# Patient Record
Sex: Female | Born: 1985 | Race: White | Hispanic: No | Marital: Married | Smoking: Never smoker
Health system: Southern US, Community
[De-identification: ages and names within clinical notes are randomized; demographics above are authoritative.]

## PROBLEM LIST (undated history)

## (undated) DIAGNOSIS — I499 Cardiac arrhythmia, unspecified: Secondary | ICD-10-CM

## (undated) DIAGNOSIS — A4901 Methicillin susceptible Staphylococcus aureus infection, unspecified site: Secondary | ICD-10-CM

## (undated) DIAGNOSIS — J189 Pneumonia, unspecified organism: Secondary | ICD-10-CM

## (undated) DIAGNOSIS — T7840XA Allergy, unspecified, initial encounter: Secondary | ICD-10-CM

## (undated) DIAGNOSIS — R87629 Unspecified abnormal cytological findings in specimens from vagina: Secondary | ICD-10-CM

## (undated) DIAGNOSIS — G43909 Migraine, unspecified, not intractable, without status migrainosus: Secondary | ICD-10-CM

## (undated) HISTORY — DX: Unspecified abnormal cytological findings in specimens from vagina: R87.629

## (undated) HISTORY — DX: Allergy, unspecified, initial encounter: T78.40XA

## (undated) HISTORY — PX: ABSCESS DRAINAGE: SHX1119

## (undated) HISTORY — DX: Migraine, unspecified, not intractable, without status migrainosus: G43.909

---

## 2004-03-20 ENCOUNTER — Other Ambulatory Visit: Admission: RE | Admit: 2004-03-20 | Discharge: 2004-03-20 | Payer: Self-pay | Admitting: Obstetrics and Gynecology

## 2004-03-21 ENCOUNTER — Other Ambulatory Visit: Admission: RE | Admit: 2004-03-21 | Discharge: 2004-03-21 | Payer: Self-pay | Admitting: Obstetrics and Gynecology

## 2006-03-09 ENCOUNTER — Emergency Department (HOSPITAL_COMMUNITY): Admission: EM | Admit: 2006-03-09 | Discharge: 2006-03-09 | Payer: Self-pay | Admitting: Emergency Medicine

## 2011-04-25 ENCOUNTER — Ambulatory Visit: Payer: Self-pay | Admitting: Family Medicine

## 2011-04-25 VITALS — BP 106/65 | HR 65 | Temp 97.8°F | Resp 16 | Ht 66.0 in | Wt 130.8 lb

## 2011-04-25 DIAGNOSIS — M545 Low back pain: Secondary | ICD-10-CM

## 2011-04-25 MED ORDER — MELOXICAM 7.5 MG PO TABS: 7.5000 mg | ORAL_TABLET | Freq: Two times a day (BID) | ORAL | Status: AC

## 2011-04-25 MED ORDER — CYCLOBENZAPRINE HCL 10 MG PO TABS: 10.0000 mg | ORAL_TABLET | Freq: Three times a day (TID) | ORAL | Status: AC | PRN

## 2011-04-25 NOTE — Progress Notes (Signed)
  Subjective:    Patient ID: Kelly Fitzgerald, female    DOB: [birth year], 26 y.o.   MRN: [id number]  HPI Patient presents with one month history of (R) sided lower back pain. Pain is worse with hip extension and when walking Paresthesias anterior thigh.  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   Pulmonary/Chest: Effort normal and breath sounds normal.  Musculoskeletal:       Lumbar back: She exhibits normal range of motion and no bony tenderness.       Back:  Neurological: She is alert.  Reflex Scores:      Patellar reflexes are 2+ on the right side and 2+ on the left side.      Achilles reflexes are 2+ on the right side and 2+ on the left side.      Neg SLR (B)      Pes planus    Assessment & Plan:   1. LBP (low back pain)  meloxicam (MOBIC) 7.5 MG tablet   Anticipatory guidance Lower back exercises provided; encouraged yoga for back and core strenghtening Proper support for shoes RTC if symptoms persist or worsen

## 2013-02-18 ENCOUNTER — Emergency Department (HOSPITAL_COMMUNITY): Payer: Self-pay

## 2013-02-18 ENCOUNTER — Encounter (HOSPITAL_COMMUNITY): Payer: Self-pay | Admitting: Emergency Medicine

## 2013-02-18 ENCOUNTER — Emergency Department (HOSPITAL_COMMUNITY)
Admission: EM | Admit: 2013-02-18 | Discharge: 2013-02-19 | Disposition: A | Discharge status: Home / Self Care | Payer: Self-pay | Attending: Emergency Medicine | Admitting: Emergency Medicine

## 2013-02-18 DIAGNOSIS — Z880 Allergy status to penicillin: Secondary | ICD-10-CM | POA: Insufficient documentation

## 2013-02-18 DIAGNOSIS — Y9389 Activity, other specified: Secondary | ICD-10-CM | POA: Insufficient documentation

## 2013-02-18 DIAGNOSIS — Y929 Unspecified place or not applicable: Secondary | ICD-10-CM | POA: Insufficient documentation

## 2013-02-18 DIAGNOSIS — W268XXA Contact with other sharp object(s), not elsewhere classified, initial encounter: Secondary | ICD-10-CM | POA: Insufficient documentation

## 2013-02-18 DIAGNOSIS — Z23 Encounter for immunization: Secondary | ICD-10-CM | POA: Insufficient documentation

## 2013-02-18 DIAGNOSIS — S61409A Unspecified open wound of unspecified hand, initial encounter: Secondary | ICD-10-CM | POA: Insufficient documentation

## 2013-02-18 DIAGNOSIS — S61412A Laceration without foreign body of left hand, initial encounter: Secondary | ICD-10-CM

## 2013-02-18 NOTE — ED Notes (Signed)
Pt was attempting to open a bottle of wine and the bottle broke; pt with laceration to left hand in between thumb and first finger; bleeding controlled

## 2013-02-19 MED ORDER — TETANUS-DIPHTH-ACELL PERTUSSIS 5-2.5-18.5 LF-MCG/0.5 IM SUSP
0.5000 mL | Freq: Once | INTRAMUSCULAR | Status: AC
  Administered 2013-02-19: 0.5 mL via INTRAMUSCULAR
  Filled 2013-02-19: qty 0.5

## 2013-02-19 NOTE — ED Provider Notes (Signed)
Medical screening examination/treatment/procedure(s) were performed by non-physician practitioner and as supervising physician I was immediately available for consultation/collaboration.  EKG Interpretation   None         Hollyn Stucky, MD 02/19/13 0746 

## 2013-02-19 NOTE — ED Provider Notes (Signed)
CSN: [id number]     Arrival date & time 02/18/13  2024 History   First MD Initiated Contact with Patient 02/18/13 2348     Chief Complaint  Patient presents with  . Extremity Laceration   (Consider location/radiation/quality/duration/timing/severity/associated sxs/prior Treatment) HPI History provided by pt.   Pt was opening a bottle of wine this evening and sustained a laceration to her L hand, between thumb and index finger.  Wound moderately painful.  Bleeding controlled. No associated paresthesias.  Last tetanus unknown.  History reviewed. No pertinent past medical history. History reviewed. No pertinent past surgical history. No family history on file. History  Substance Use Topics  . Smoking status: Never Smoker   . Smokeless tobacco: Not on file  . Alcohol Use: Yes     Comment: occ   OB History   Grav Para Term Preterm Abortions TAB SAB Ect Mult Living                 Review of Systems  All other systems reviewed and are negative.    Allergies  Amoxicillin; Erythromycin; and Penicillins  Home Medications   Current Outpatient Rx  Name  Route  Sig  Dispense  Refill  . acetaminophen (TYLENOL) 500 MG tablet   Oral   Take 500 mg by mouth every 6 (six) hours as needed for headache.         Marland Kitchen. aspirin-acetaminophen-caffeine (EXCEDRIN MIGRAINE) 250-250-65 MG per tablet   Oral   Take 1 tablet by mouth every 6 (six) hours as needed for headache.         . Multiple Vitamin (MULTIVITAMIN WITH MINERALS) TABS tablet   Oral   Take 1 tablet by mouth daily.         . Pseudoephedrine-APAP-DM (DAYQUIL PO)   Oral   Take 2 tablets by mouth every 4 (four) hours as needed (cold symptoms).          BP 145/72  Pulse 78  Temp(Src) 98.3 F (36.8 C) (Oral)  Resp 16  Ht 5\' 6"  (1.676 m)  Wt 140 lb (63.504 kg)  BMI 22.61 kg/m2  SpO2 100%  LMP 01/29/2013 Physical Exam  Nursing note and vitals reviewed. Constitutional: She is oriented to person, place, and time. She  appears well-developed and well-nourished. No distress.  HENT:  Head: Normocephalic and atraumatic.  Eyes:  Normal appearance  Neck: Normal range of motion.  Pulmonary/Chest: Effort normal.  Musculoskeletal: Normal range of motion.  2.0cm lac on dorsal aspect of web between L thumb and index finger.  Subq.  Clean and hemostatic.  Ttp.  No tendon involvement.  Distal sensation intact.   Neurological: She is alert and oriented to person, place, and time.  Psychiatric: She has a normal mood and affect. Her behavior is normal.    ED Course  Procedures (including critical care time) LACERATION REPAIR Performed by: Otilio MiuSCHINLEVER, Mandrell Vangilder E Authorized by: Ruby ColaSCHINLEVER, Zekiah Caruth E Consent: Verbal consent obtained. Risks and benefits: risks, benefits and alternatives were discussed Consent given by: patient Patient identity confirmed: provided demographic data Prepped and Draped in normal sterile fashion Wound explored  Laceration Location: L hand  Laceration Length: 2cm  No Foreign Bodies seen or palpated  Anesthesia: local infiltration  Local anesthetic: lidocaine 2% w/out epinephrine  Anesthetic total: 4 ml  Irrigation method: syringe Amount of cleaning: standard  Skin closure: prolene 4.0  Number of sutures: 5  Technique: simple interrupted  Patient tolerance: Patient tolerated the procedure well with no immediate complications.  Labs Review Labs Reviewed - No data to display Imaging Review Dg Hand Complete Left  02/18/2013   CLINICAL DATA:  Laceration.  Pain.  EXAM: LEFT HAND - COMPLETE 3+ VIEW  COMPARISON:  None.  FINDINGS: There is no acute osseous abnormality. No radiodense foreign body in the soft tissues. Tiny cystic area in the scaphoid. This is not significant.  IMPRESSION: No acute osseous abnormality.  No visible radiodense foreign bodies.   Electronically Signed   By: Geanie Cooley M.D.   On: 02/18/2013 21:31    EKG Interpretation   None       MDM    1. Laceration of left hand    28yo F presents w/ lac of L hand.  Wound cleaned and sutured and tetanus updated.  Referred to [facility] for suture removal.  Return precautions discussed.     Otilio Miu, PA-C 02/19/13 [phone]

## 2013-02-19 NOTE — Discharge Instructions (Signed)
Take tylenol or ibuprofen as needed for pain. Keep wound clean and dry.  [facility] your [facility] care doctor or [facility] [phone]; [address]) in 7-10 days for wound recheck and suture removal.  You should be seen sooner if you develop fever, worsening pain or redness/drainage of pus at site of wound.     Laceration Care, Adult A laceration is a cut or lesion that goes through all layers of the skin and into the tissue just beneath the skin. TREATMENT  Some lacerations may not require closure. Some lacerations may not be able to be closed due to an increased risk of infection. It is important to see your caregiver as soon as possible after an injury to minimize the risk of infection and maximize the opportunity for successful closure. If closure is appropriate, pain medicines may be given, if needed. The wound will be cleaned to help prevent infection. Your caregiver will use stitches (sutures), staples, wound glue (adhesive), or skin adhesive strips to repair the laceration. These tools bring the skin edges together to allow for faster healing and a better cosmetic outcome. However, all wounds will heal with a scar. Once the wound has healed, scarring can be minimized by covering the wound with sunscreen during the day for 1 full year. HOME CARE INSTRUCTIONS  For sutures or staples:  Keep the wound clean and dry.  If you were given a bandage (dressing), you should change it at least once a day. Also, change the dressing if it becomes wet or dirty, or as directed by your caregiver.  Wash the wound with soap and water 2 times a day. Rinse the wound off with water to remove all soap. Pat the wound dry with a clean towel.  After cleaning, apply a thin layer of the antibiotic ointment as recommended by your caregiver. This will help prevent infection and keep the dressing from sticking.  You may shower as usual after the first 24 hours. Do not soak the wound in water until the  sutures are removed.  Only take over-the-counter or prescription medicines for pain, discomfort, or fever as directed by your caregiver.  Get your sutures or staples removed as directed by your caregiver. For skin adhesive strips:  Keep the wound clean and dry.  Do not get the skin adhesive strips wet. You may bathe carefully, using caution to keep the wound dry.  If the wound gets wet, pat it dry with a clean towel.  Skin adhesive strips will fall off on their own. You may trim the strips as the wound heals. Do not remove skin adhesive strips that are still stuck to the wound. They will fall off in time. For wound adhesive:  You may briefly wet your wound in the shower or bath. Do not soak or scrub the wound. Do not swim. Avoid periods of heavy perspiration until the skin adhesive has fallen off on its own. After showering or bathing, gently pat the wound dry with a clean towel.  Do not apply liquid medicine, cream medicine, or ointment medicine to your wound while the skin adhesive is in place. This may loosen the film before your wound is healed.  If a dressing is placed over the wound, be careful not to apply tape directly over the skin adhesive. This may cause the adhesive to be pulled off before the wound is healed.  Avoid prolonged exposure to sunlight or tanning lamps while the skin adhesive is in place.  Exposure to ultraviolet light in the first year will darken the scar.  The skin adhesive will usually remain in place for 5 to 10 days, then naturally fall off the skin. Do not pick at the adhesive film. You may need a tetanus shot if:  You cannot remember when you had your last tetanus shot.  You have never had a tetanus shot. If you get a tetanus shot, your arm may swell, get red, and feel warm to the touch. This is common and not a problem. If you need a tetanus shot and you choose not to have one, there is a rare chance of getting tetanus. Sickness from tetanus can be  serious. SEEK MEDICAL CARE IF:   You have redness, swelling, or increasing pain in the wound.  You see a red line that goes away from the wound.  You have yellowish-white fluid (pus) coming from the wound.  You have a fever.  You notice a bad smell coming from the wound or dressing.  Your wound breaks open before or after sutures have been removed.  You notice something coming out of the wound such as wood or glass.  Your wound is on your hand or foot and you cannot move a finger or toe. SEEK IMMEDIATE MEDICAL CARE IF:   Your pain is not controlled with prescribed medicine.  You have severe swelling around the wound causing pain and numbness or a change in color in your arm, hand, leg, or foot.  Your wound splits open and starts bleeding.  You have worsening numbness, weakness, or loss of function of any joint around or beyond the wound.  You develop painful lumps near the wound or on the skin anywhere on your body. MAKE SURE YOU:   Understand these instructions.  Will watch your condition.  Will get help right away if you are not doing well or get worse. Document Released: 01/14/2005 Document Revised: 04/08/2011 Document Reviewed: 07/10/2010 [facility] Patient Information 2014 [place], [place].

## 2013-05-28 ENCOUNTER — Other Ambulatory Visit (HOSPITAL_COMMUNITY)
Admission: RE | Admit: 2013-05-28 | Discharge: 2013-05-28 | Disposition: A | Discharge status: Home / Self Care | Payer: Self-pay | Source: Ambulatory Visit | Attending: Women's Health | Admitting: Women's Health

## 2013-05-28 ENCOUNTER — Encounter: Payer: Self-pay | Admitting: Women's Health

## 2013-05-28 ENCOUNTER — Ambulatory Visit (INDEPENDENT_AMBULATORY_CARE_PROVIDER_SITE_OTHER): Payer: Self-pay | Admitting: Women's Health

## 2013-05-28 VITALS — BP 108/70 | Ht 65.0 in | Wt 137.0 lb

## 2013-05-28 DIAGNOSIS — R87810 Cervical high risk human papillomavirus (HPV) DNA test positive: Secondary | ICD-10-CM | POA: Insufficient documentation

## 2013-05-28 DIAGNOSIS — Z01419 Encounter for gynecological examination (general) (routine) without abnormal findings: Secondary | ICD-10-CM

## 2013-05-28 DIAGNOSIS — Z124 Encounter for screening for malignant neoplasm of cervix: Secondary | ICD-10-CM | POA: Insufficient documentation

## 2013-05-28 DIAGNOSIS — Z309 Encounter for contraceptive management, unspecified: Secondary | ICD-10-CM

## 2013-05-28 DIAGNOSIS — Z1151 Encounter for screening for human papillomavirus (HPV): Secondary | ICD-10-CM | POA: Insufficient documentation

## 2013-05-28 DIAGNOSIS — IMO0001 Reserved for inherently not codable concepts without codable children: Secondary | ICD-10-CM

## 2013-05-28 LAB — CBC WITH DIFFERENTIAL/PLATELET
Basophils Absolute: 0.1 10*3/uL (ref 0.0–0.1)
Basophils Relative: 1 % (ref 0–1)
EOS PCT: 3 % (ref 0–5)
Eosinophils Absolute: 0.2 10*3/uL (ref 0.0–0.7)
HEMATOCRIT: 36.2 % (ref 36.0–46.0)
Hemoglobin: 12.3 g/dL (ref 12.0–15.0)
LYMPHS ABS: 1.8 10*3/uL (ref 0.7–4.0)
LYMPHS PCT: 30 % (ref 12–46)
MCH: 28.4 pg (ref 26.0–34.0)
MCHC: 34 g/dL (ref 30.0–36.0)
MCV: 83.6 fL (ref 78.0–100.0)
MONO ABS: 0.5 10*3/uL (ref 0.1–1.0)
MONOS PCT: 8 % (ref 3–12)
NEUTROS ABS: 3.5 10*3/uL (ref 1.7–7.7)
Neutrophils Relative %: 58 % (ref 43–77)
Platelets: 331 10*3/uL (ref 150–400)
RBC: 4.33 MIL/uL (ref 3.87–5.11)
RDW: 13.9 % (ref 11.5–15.5)
WBC: 6 10*3/uL (ref 4.0–10.5)

## 2013-05-28 MED ORDER — NORGESTIMATE-ETH ESTRADIOL 0.25-35 MG-MCG PO TABS: 1.0000 | ORAL_TABLET | Freq: Every day | ORAL | Status: DC

## 2013-05-28 NOTE — Patient Instructions (Signed)

## 2013-05-28 NOTE — Progress Notes (Signed)
Kelly Fitzgerald 07/31/1985 [id number]    History:    Presents for annual exam/new patient.  Monthly cycles/condoms. Same partner greater than 4 years with negative STD screen. Did not receive gardasil. States has had one abnormal Pap in the past that with recheck was normal.  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  ROS:  A  ROS was performed and pertinent positives and negatives are included.  Exam:  Filed Vitals:   05/28/13 0910  BP: 108/70    General appearance:  Normal Thyroid:  Symmetrical, normal in size, without palpable masses or nodularity. Respiratory  Auscultation:  Clear without wheezing or rhonchi Cardiovascular  Auscultation:  Regular rate, without rubs, murmurs or gallops  Edema/varicosities:  Not grossly evident Abdominal  Soft,nontender, without masses, guarding or rebound.  Liver/spleen:  No organomegaly noted  Hernia:  None appreciated  Skin  Inspection:  Grossly normal   Breasts: Examined lying and sitting.     Right: Without masses, retractions, discharge or axillary adenopathy.     Left: Without masses, retractions, discharge or axillary adenopathy. Gentitourinary   Inguinal/mons:  Normal without inguinal adenopathy  External genitalia:  Normal  BUS/Urethra/Skene's glands:  Normal  Vagina:  Normal  Cervix:  Normal  Uterus:  Retroverted normal in size, shape and contour.  Midline and mobile  Adnexa/parametria:     Rt: Without masses or tenderness.   Lt: Without masses or tenderness.  Anus and perineum: Normal   Assessment/Plan:  28 y.o. SWF G0 for annual exam with no complaints.  Contraception management  Plan: Options reviewed, Sprintec prescription, proper use, slight risk for blood clots and strokes reviewed. Start with next cycle, continue condoms first month. SBE's, regular exercise, calcium  rich diet, MVI daily encouraged. CBC, Pap.    Harrington Challengerancy J Raevin Wierenga [facility] LLCWHNP, 12:43 PM 05/28/2013

## 2013-07-02 ENCOUNTER — Ambulatory Visit: Payer: Self-pay | Admitting: Gynecology

## 2013-07-12 ENCOUNTER — Ambulatory Visit: Payer: Self-pay | Admitting: Gynecology

## 2013-08-05 ENCOUNTER — Ambulatory Visit (INDEPENDENT_AMBULATORY_CARE_PROVIDER_SITE_OTHER): Payer: Self-pay | Admitting: Gynecology

## 2013-08-05 ENCOUNTER — Encounter: Payer: Self-pay | Admitting: Gynecology

## 2013-08-05 VITALS — BP 108/76

## 2013-08-05 DIAGNOSIS — B373 Candidiasis of vulva and vagina: Secondary | ICD-10-CM

## 2013-08-05 DIAGNOSIS — IMO0002 Reserved for concepts with insufficient information to code with codable children: Secondary | ICD-10-CM | POA: Insufficient documentation

## 2013-08-05 DIAGNOSIS — B3731 Acute candidiasis of vulva and vagina: Secondary | ICD-10-CM

## 2013-08-05 DIAGNOSIS — R6889 Other general symptoms and signs: Secondary | ICD-10-CM

## 2013-08-05 MED ORDER — FLUCONAZOLE 150 MG PO TABS: 150.0000 mg | ORAL_TABLET | Freq: Once | ORAL | Status: DC

## 2013-08-05 NOTE — Patient Instructions (Addendum)
Colposcopy Colposcopy is a procedure to examine your cervix and vagina, or the area around the outside of your vagina, for abnormalities or signs of disease. The procedure is done using a lighted microscope called a colposcope. Tissue samples may be collected during the colposcopy if your health care provider finds any unusual cells. A colposcopy may be done if a woman has:  An abnormal Pap test. A Pap test is a medical test done to evaluate cells that are on the surface of the cervix.  A Pap test result that is suggestive of human papillomavirus (HPV). This virus can cause genital warts and is linked to the development of cervical cancer.  A sore on her cervix and the results of a Pap test were normal.  Genital warts on the cervix or in or around the outside of the vagina.  A mother who took the drug diethylstilbestrol (DES) while pregnant.  Painful intercourse.  Vaginal bleeding, especially after sexual intercourse. LET [facility] CARE PROVIDER KNOW ABOUT:  Any allergies you have.  All medicines you are taking, including vitamins, herbs, eye drops, creams, and over-the-counter medicines.  Previous problems you or members of your family have had with the use of anesthetics.  Any blood disorders you have.  Previous surgeries you have had.  Medical conditions you have. RISKS AND COMPLICATIONS Generally, a colposcopy is a safe procedure. However, as with any procedure, complications can occur. Possible complications include:  Bleeding.  Infection.  Missed lesions. BEFORE THE PROCEDURE   Tell your health care provider if you have your menstrual period. A colposcopy typically is not done during menstruation.  For 24 hours before the colposcopy, do not:  Douche.  Use tampons.  Use medicines, creams, or suppositories in the vagina.  Have sexual intercourse. PROCEDURE  During the procedure, you will be lying on your back with your feet in foot rests (stirrups). A warm  metal or plastic instrument (speculum) will be placed in your vagina to keep it open and to allow the health care provider to see the cervix. The colposcope will be placed outside the vagina. It will be used to magnify and examine the cervix, vagina, and the area around the outside of the vagina. A small amount of liquid solution will be placed on the area that is to be viewed. This solution will make it easier to see the abnormal cells. Your health care provider will use tools to suck out mucus and cells from the canal of the cervix. Then he or she will record the location of the abnormal areas. If a biopsy is done during the procedure, a medicine will usually be given to numb the area (local anesthetic). You may feel mild pain or cramping while the biopsy is done. After the procedure, tissue samples collected during the biopsy will be sent to a lab for analysis. AFTER THE PROCEDURE  You will be given instructions on when to follow up with your health care provider for your test results. It is important to keep your appointment. Document Released: 04/06/2002 Document Revised: 09/16/2012 Document Reviewed: 08/13/2012 [facility] Patient Information 2015 [place], [place]. This information is not intended to replace advice given to you by your health care provider. Make sure you discuss any questions you have with your health care provider. Monilial Vaginitis Vaginitis in a soreness, swelling and redness (inflammation) of the vagina and vulva. Monilial vaginitis is not a sexually transmitted infection. CAUSES  Yeast vaginitis is caused by yeast (candida) that is normally found in your  vagina. With a yeast infection, the candida has overgrown in number to a point that upsets the chemical balance. SYMPTOMS   White, thick vaginal discharge.  Swelling, itching, redness and irritation of the vagina and possibly the lips of the vagina (vulva).  Burning or painful urination.  Painful intercourse. DIAGNOSIS    Things that may contribute to monilial vaginitis are:  Postmenopausal and virginal states.  Pregnancy.  Infections.  Being tired, sick or stressed, especially if you had monilial vaginitis in the past.  Diabetes. Good control will help lower the chance.  Birth control pills.  Tight fitting garments.  Using bubble bath, feminine sprays, douches or deodorant tampons.  Taking certain medications that kill germs (antibiotics).  Sporadic recurrence can occur if you become ill. TREATMENT  Your caregiver will give you medication.  There are several kinds of anti monilial vaginal creams and suppositories specific for monilial vaginitis. For recurrent yeast infections, use a suppository or cream in the vagina 2 times a week, or as directed.  Anti-monilial or steroid cream for the itching or irritation of the vulva may also be used. Get your caregiver's permission.  Painting the vagina with methylene blue solution may help if the monilial cream does not work.  Eating yogurt may help prevent monilial vaginitis. HOME CARE INSTRUCTIONS   Finish all medication as prescribed.  Do not have sex until treatment is completed or after your caregiver tells you it is okay.  Take warm sitz baths.  Do not douche.  Do not use tampons, especially scented ones.  Wear cotton underwear.  Avoid tight pants and panty hose.  Tell your sexual partner that you have a yeast infection. They should go to their caregiver if they have symptoms such as mild rash or itching.  Your sexual partner should be treated as well if your infection is difficult to eliminate.  Practice safer sex. Use condoms.  Some vaginal medications cause latex condoms to fail. Vaginal medications that harm condoms are:  Cleocin cream.  Butoconazole (Femstat).  Terconazole (Terazol) vaginal suppository.  Miconazole (Monistat) (may be purchased over the counter). SEEK MEDICAL CARE IF:   You have a temperature by  mouth above 102 F (38.9 C).  The infection is getting worse after 2 days of treatment.  The infection is not getting better after 3 days of treatment.  You develop blisters in or around your vagina.  You develop vaginal bleeding, and it is not your menstrual period.  You have pain when you urinate.  You develop intestinal problems.  You have pain with sexual intercourse. Document Released: 10/24/2004 Document Revised: 04/08/2011 Document Reviewed: 07/08/2008 [facility] Patient Information 2015 [place], [place]. This information is not intended to replace advice given to you by your health care provider. Make sure you discuss any questions you have with your health care provider.

## 2013-08-05 NOTE — Progress Notes (Signed)
   28 year old who presented to the office today for colposcopic evaluation as a result of her abnormal Pap smear, for her annual gynecological examination made first of this year. Patient stated that many years ago approximately 5 years ago at the [facility] she had an abnormal Pap smear for which colposcopic evaluation and biopsy was done but was only followed with Pap smear afterwards. We have no records.  Recent Pap smear demonstrated the following:  Diagnosis ATYPICAL SQUAMOUS CELLS OF UNDETERMINED SIGNIFICANCE (ASC-US). ORGANISM(S) FUNGAL ORGANISMS PRESENT CONSISTENT WITH CANDIDA.  Patient was counseled for colposcopic evaluation and findings described as follows: Extensive colposcopic evaluation of the external genitalia perineum and perirectal region demonstrated no lesions. The speculum was then introduced. Systematic inspection of the vagina cervix and fornix was performed and acetic acid was applied and the following was noted on the cervix:  Physical Exam  Genitourinary:     Monsel solution and silver nitrate was used for hemostasis.  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 Will notify patient with biopsy results and plan a course of management. Literature information was provided. Patient currently on oral contraceptive pills for contraception. For her yeast infection she was prescribed Diflucan 150 mg one by mouth today. She will refrain from intercourse for one week.

## 2013-08-24 ENCOUNTER — Institutional Professional Consult (permissible substitution): Payer: Self-pay | Admitting: Gynecology

## 2013-09-07 ENCOUNTER — Encounter: Payer: Self-pay | Admitting: Gynecology

## 2013-09-07 ENCOUNTER — Ambulatory Visit (INDEPENDENT_AMBULATORY_CARE_PROVIDER_SITE_OTHER): Payer: Self-pay | Admitting: Gynecology

## 2013-09-07 VITALS — BP 108/76

## 2013-09-07 DIAGNOSIS — N871 Moderate cervical dysplasia: Secondary | ICD-10-CM | POA: Insufficient documentation

## 2013-09-07 MED ORDER — CLINDAMYCIN PHOSPHATE 2 % VA CREA: 1.0000 | TOPICAL_CREAM | Freq: Every day | VAGINAL | Status: DC

## 2013-09-07 MED ORDER — METOCLOPRAMIDE HCL 10 MG PO TABS: 10.0000 mg | ORAL_TABLET | Freq: Three times a day (TID) | ORAL | Status: DC

## 2013-09-07 NOTE — Progress Notes (Signed)
 Kelly Fitzgerald is an 28 y.o. female. Who presents to the office today for preop consultation. Patient was seen in the office on 08/05/2013 for colposcopic evaluation as a result of her abnormal Pap smear, for her annual gynecological examination made first of this year. Patient stated that many years ago approximately 5 years ago at the [facility] she had an abnormal Pap smear for which colposcopic evaluation and biopsy was done but was only followed with Pap smear afterwards. We have no records.  Pap smear May 2015 here in the office demonstrated the following: Diagnosis  ATYPICAL SQUAMOUS CELLS OF UNDETERMINED SIGNIFICANCE (ASC-US).  ORGANISM(S)  FUNGAL ORGANISMS PRESENT CONSISTENT WITH CANDIDA.  Patient underwent detail colposcopic evaluation please see picture from office visit of 08/05/2013 for specific detail. Pathology report demonstrated the following:  1. Endocervix, curettage - BENIGN ENDOCERVICAL MUCOSA. - DETACHED FRAGMENTS OF LOW GRADE SQUAMOUS INTRAEPITHELIAL LESION, CIN-I (MILD DYSPLASIA). 2. Cervix, biopsy, 1 o'clock - LOW GRADE SQUAMOUS INTRAEPITHELIAL LESION, CIN-I (MILD DYSPLASIA), SEE COMMENT. 3. Cervix, biopsy, 10 o'clock - HIGH GRADE SQUAMOUS INTRAEPITHELIAL LESION, CIN-II (MODERATE DYSPLASIA), WITH ENDOCERVICAL GLANDULAR EXTENSION, SEE COMMENT. Microscopic Comment 2. , 3. The findings correlate with the previous Pap test results  The patient was offered different treatment options the best would be to proceed with CO2 laser ablation of CIN-1 and CIN-2 of the cervix in an outpatient setting.   Pertinent Gynecological History: Menses: Regular Bleeding: Regular cycles Contraception: OCP (estrogen/progesterone) DES exposure: unknown Blood transfusions: none Sexually transmitted diseases: no past history Previous GYN Procedures: no  Last mammogram: not indicated Date: not indicated Last pap: abnormal: See above Date: See above  OB History: G0,  P0   Menstrual History: Menarche age: 9  No LMP recorded.    Past Medical History  Diagnosis Date  . Migraines     No past surgical history on file.  Family History  Problem Relation Age of Onset  . Hypertension Mother   . Cancer Father     prostate  . Ovarian cancer Paternal Grandmother     Social History:  reports that she has never smoked. She has never used smokeless tobacco. She reports that she drinks alcohol. She reports that she does not use illicit drugs.  Allergies:  Allergies  Allergen Reactions  . Amoxicillin Hives  . Erythromycin Nausea And Vomiting  . Penicillins Hives     (Not in a hospital admission)  REVIEW OF SYSTEMS: A ROS was performed and pertinent positives and negatives are included in the history.  GENERAL: No fevers or chills. HEENT: No change in vision, no earache, sore throat or sinus congestion. NECK: No pain or stiffness. CARDIOVASCULAR: No chest pain or pressure. No palpitations. PULMONARY: No shortness of breath, cough or wheeze. GASTROINTESTINAL: No abdominal pain, nausea, vomiting or diarrhea, melena or bright red blood per rectum. GENITOURINARY: No urinary frequency, urgency, hesitancy or dysuria. MUSCULOSKELETAL: No joint or muscle pain, no back pain, no recent trauma. DERMATOLOGIC: No rash, no itching, no lesions. ENDOCRINE: No polyuria, polydipsia, no heat or cold intolerance. No recent change in weight. HEMATOLOGICAL: No anemia or easy bruising or bleeding. NEUROLOGIC: No headache, seizures, numbness, tingling or weakness. PSYCHIATRIC: No depression, no loss of interest in normal activity or change in sleep pattern.     Blood pressure 108/76.  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 Risks benefits and pros and cons were discussed with the patient. The additional risks discussed were as follows:                        Patient was counseled as to the risk of surgery to include the following:  1. Infection (prohylactic antibiotics will be administered)  2. DVT/Pulmonary Embolism (prophylactic pneumo compression stockings will be used)  3.Trauma to internal organs requiring additional surgical procedure to repair any injury to     Internal organs requiring perhaps additional hospitalization days.  4.Hemmorhage requiring transfusion and blood products which carry risks such as anaphylactic reaction, hepatitis and AIDS  Patient had received literature information on the procedure scheduled and all her questions were answered and fully accepts all risk.   [facility] IncFERNANDEZ,Shalae Belmonte HMD8:36 AMTD@Note : This dictation was prepared with  Dragon/digital dictation along withSmart phrase technology. Any transcriptional errors that result from this process are unintentional.      Ok EdwardsFERNANDEZ,Gibran Veselka H 09/07/2013, 8:16 AM  Note: This dictation was prepared with  Dragon/digital dictation along withSmart phrase technology. Any transcriptional errors that result from this process are unintentional.

## 2013-09-08 ENCOUNTER — Encounter (HOSPITAL_COMMUNITY): Payer: Self-pay | Admitting: *Deleted

## 2013-09-21 ENCOUNTER — Encounter (HOSPITAL_COMMUNITY): Payer: Self-pay | Admitting: Pharmacist

## 2013-09-21 ENCOUNTER — Encounter (HOSPITAL_COMMUNITY): Payer: Self-pay

## 2013-09-30 ENCOUNTER — Ambulatory Visit (HOSPITAL_COMMUNITY)
Admission: RE | Admit: 2013-09-30 | Discharge: 2013-09-30 | Disposition: A | Discharge status: Home / Self Care | Payer: 59 | Source: Ambulatory Visit | Attending: Gynecology | Admitting: Gynecology

## 2013-09-30 ENCOUNTER — Encounter (HOSPITAL_COMMUNITY)
Admission: RE | Disposition: A | Discharge status: Home / Self Care | Payer: Self-pay | Source: Ambulatory Visit | Attending: Gynecology

## 2013-09-30 ENCOUNTER — Encounter (HOSPITAL_COMMUNITY): Payer: 59 | Admitting: Certified Registered Nurse Anesthetist

## 2013-09-30 ENCOUNTER — Ambulatory Visit (HOSPITAL_COMMUNITY): Payer: 59 | Admitting: Certified Registered Nurse Anesthetist

## 2013-09-30 ENCOUNTER — Encounter (HOSPITAL_COMMUNITY): Payer: Self-pay | Admitting: Certified Registered Nurse Anesthetist

## 2013-09-30 DIAGNOSIS — N870 Mild cervical dysplasia: Secondary | ICD-10-CM

## 2013-09-30 DIAGNOSIS — Z880 Allergy status to penicillin: Secondary | ICD-10-CM | POA: Insufficient documentation

## 2013-09-30 DIAGNOSIS — N871 Moderate cervical dysplasia: Secondary | ICD-10-CM | POA: Insufficient documentation

## 2013-09-30 DIAGNOSIS — R51 Headache: Secondary | ICD-10-CM | POA: Diagnosis not present

## 2013-09-30 DIAGNOSIS — D649 Anemia, unspecified: Secondary | ICD-10-CM | POA: Diagnosis not present

## 2013-09-30 DIAGNOSIS — Z9889 Other specified postprocedural states: Secondary | ICD-10-CM

## 2013-09-30 HISTORY — DX: Cardiac arrhythmia, unspecified: I49.9

## 2013-09-30 HISTORY — DX: Pneumonia, unspecified organism: J18.9

## 2013-09-30 HISTORY — DX: Methicillin susceptible Staphylococcus aureus infection, unspecified site: A49.01

## 2013-09-30 HISTORY — PX: CO2 LASER APPLICATION: SHX5778

## 2013-09-30 LAB — CBC
HEMATOCRIT: 34.8 % — AB (ref 36.0–46.0)
Hemoglobin: 11.5 g/dL — ABNORMAL LOW (ref 12.0–15.0)
MCH: 28.7 pg (ref 26.0–34.0)
MCHC: 33 g/dL (ref 30.0–36.0)
MCV: 86.8 fL (ref 78.0–100.0)
Platelets: 283 10*3/uL (ref 150–400)
RBC: 4.01 MIL/uL (ref 3.87–5.11)
RDW: 13.8 % (ref 11.5–15.5)
WBC: 7.5 10*3/uL (ref 4.0–10.5)

## 2013-09-30 LAB — PREGNANCY, URINE: PREG TEST UR: NEGATIVE

## 2013-09-30 SURGERY — CO2 LASER APPLICATION
Anesthesia: Monitor Anesthesia Care

## 2013-09-30 MED ORDER — SCOPOLAMINE 1 MG/3DAYS TD PT72
MEDICATED_PATCH | TRANSDERMAL | Status: AC
  Filled 2013-09-30: qty 1

## 2013-09-30 MED ORDER — LIDOCAINE-EPINEPHRINE 1 %-1:100000 IJ SOLN
INTRAMUSCULAR | Status: AC
  Filled 2013-09-30: qty 1

## 2013-09-30 MED ORDER — FENTANYL CITRATE 0.05 MG/ML IJ SOLN
INTRAMUSCULAR | Status: AC
  Filled 2013-09-30: qty 2

## 2013-09-30 MED ORDER — FENTANYL CITRATE 0.05 MG/ML IJ SOLN: 25.0000 ug | INTRAMUSCULAR | Status: DC | PRN

## 2013-09-30 MED ORDER — ACETIC ACID 5 % SOLN
Status: AC
  Filled 2013-09-30: qty 500

## 2013-09-30 MED ORDER — LIDOCAINE-EPINEPHRINE 1 %-1:100000 IJ SOLN
INTRAMUSCULAR | Status: DC | PRN
  Administered 2013-09-30: 20 mL

## 2013-09-30 MED ORDER — MIDAZOLAM HCL 2 MG/2ML IJ SOLN
INTRAMUSCULAR | Status: AC
  Filled 2013-09-30: qty 2

## 2013-09-30 MED ORDER — PROPOFOL 10 MG/ML IV EMUL
INTRAVENOUS | Status: DC | PRN
  Administered 2013-09-30: 50 mg via INTRAVENOUS
  Administered 2013-09-30: 100 mg via INTRAVENOUS
  Administered 2013-09-30 (×2): 50 mg via INTRAVENOUS

## 2013-09-30 MED ORDER — LIDOCAINE HCL (CARDIAC) 20 MG/ML IV SOLN
INTRAVENOUS | Status: DC | PRN
  Administered 2013-09-30: 30 mg via INTRAVENOUS

## 2013-09-30 MED ORDER — FENTANYL CITRATE 0.05 MG/ML IJ SOLN
INTRAMUSCULAR | Status: DC | PRN
  Administered 2013-09-30: 100 ug via INTRAVENOUS

## 2013-09-30 MED ORDER — KETOROLAC TROMETHAMINE 30 MG/ML IJ SOLN
INTRAMUSCULAR | Status: DC | PRN
  Administered 2013-09-30: 30 mg via INTRAVENOUS

## 2013-09-30 MED ORDER — MIDAZOLAM HCL 2 MG/2ML IJ SOLN
INTRAMUSCULAR | Status: DC | PRN
  Administered 2013-09-30: 2 mg via INTRAVENOUS

## 2013-09-30 MED ORDER — PROPOFOL 10 MG/ML IV EMUL
INTRAVENOUS | Status: AC
  Filled 2013-09-30: qty 20

## 2013-09-30 MED ORDER — KETOROLAC TROMETHAMINE 30 MG/ML IJ SOLN: 15.0000 mg | Freq: Once | INTRAMUSCULAR | Status: DC | PRN

## 2013-09-30 MED ORDER — ACETIC ACID 5 % SOLN
Status: DC | PRN
  Administered 2013-09-30: 1 via TOPICAL

## 2013-09-30 MED ORDER — FERRIC SUBSULFATE 259 MG/GM EX SOLN
CUTANEOUS | Status: AC
  Filled 2013-09-30: qty 8

## 2013-09-30 MED ORDER — PROMETHAZINE HCL 25 MG/ML IJ SOLN: 6.2500 mg | INTRAMUSCULAR | Status: DC | PRN

## 2013-09-30 MED ORDER — LACTATED RINGERS IV SOLN
INTRAVENOUS | Status: DC
  Administered 2013-09-30 (×2): via INTRAVENOUS

## 2013-09-30 MED ORDER — PROPOFOL INFUSION 10 MG/ML OPTIME: INTRAVENOUS | Status: DC | PRN

## 2013-09-30 MED ORDER — DEXAMETHASONE SODIUM PHOSPHATE 10 MG/ML IJ SOLN
INTRAMUSCULAR | Status: DC | PRN
  Administered 2013-09-30: 5 mg via INTRAVENOUS

## 2013-09-30 MED ORDER — SCOPOLAMINE 1 MG/3DAYS TD PT72
1.0000 | MEDICATED_PATCH | Freq: Once | TRANSDERMAL | Status: DC
  Administered 2013-09-30: 1.5 mg via TRANSDERMAL

## 2013-09-30 MED ORDER — KETOROLAC TROMETHAMINE 30 MG/ML IJ SOLN
INTRAMUSCULAR | Status: AC
  Filled 2013-09-30: qty 1

## 2013-09-30 MED ORDER — MEPERIDINE HCL 25 MG/ML IJ SOLN: 6.2500 mg | INTRAMUSCULAR | Status: DC | PRN

## 2013-09-30 MED ORDER — SILVER SULFADIAZINE 1 % EX CREA
TOPICAL_CREAM | CUTANEOUS | Status: AC
  Filled 2013-09-30: qty 50

## 2013-09-30 MED ORDER — MIDAZOLAM HCL 2 MG/2ML IJ SOLN: INTRAMUSCULAR | Status: DC | PRN

## 2013-09-30 MED ORDER — ONDANSETRON HCL 4 MG/2ML IJ SOLN
INTRAMUSCULAR | Status: DC | PRN
  Administered 2013-09-30: 4 mg via INTRAVENOUS

## 2013-09-30 MED ORDER — ONDANSETRON HCL 4 MG/2ML IJ SOLN
INTRAMUSCULAR | Status: AC
  Filled 2013-09-30: qty 2

## 2013-09-30 SURGICAL SUPPLY — 18 items
CATH ROBINSON RED A/P 16FR (CATHETERS) ×1 IMPLANT
CLOTH BEACON ORANGE TIMEOUT ST (SAFETY) ×2 IMPLANT
CONTAINER PREFILL 10% NBF 60ML (FORM) IMPLANT
DEPRESSOR TONGUE BLADE STERILE (MISCELLANEOUS) ×2 IMPLANT
EVACUATOR PREFILTER SMOKE (MISCELLANEOUS) ×2 IMPLANT
GLOVE BIOGEL PI IND STRL 8 (GLOVE) ×1 IMPLANT
GLOVE BIOGEL PI INDICATOR 8 (GLOVE) ×1
GLOVE ECLIPSE 7.5 STRL STRAW (GLOVE) ×2 IMPLANT
GOWN STRL REUS W/TWL LRG LVL3 (GOWN DISPOSABLE) ×4 IMPLANT
HOSE NS SMOKE EVAC 7/8 X6 (MISCELLANEOUS) ×2 IMPLANT
PACK VAGINAL MINOR WOMEN LF (CUSTOM PROCEDURE TRAY) ×2 IMPLANT
PAD OB MATERNITY 4.3X12.25 (PERSONAL CARE ITEMS) ×2 IMPLANT
PAD PREP 24X48 CUFFED NSTRL (MISCELLANEOUS) ×2 IMPLANT
REDUCER FITTING SMOKE EVAC (MISCELLANEOUS) ×2 IMPLANT
SCOPETTES 8  STERILE (MISCELLANEOUS) ×1
SCOPETTES 8 STERILE (MISCELLANEOUS) ×1 IMPLANT
TOWEL OR 17X24 6PK STRL BLUE (TOWEL DISPOSABLE) ×4 IMPLANT
WATER STERILE IRR 1000ML POUR (IV SOLUTION) ×2 IMPLANT

## 2013-09-30 NOTE — Transfer of Care (Signed)
Immediate Anesthesia Transfer of Care Note  Patient: Kelly Fitzgerald  Procedure(s) Performed: Procedure(s): CO2 LASER OF CERVIX (N/A)  Patient Location: PACU  Anesthesia Type:MAC  Level of Consciousness: awake, alert  and oriented  Airway & Oxygen Therapy: Patient Spontanous Breathing and Patient connected to nasal cannula oxygen  Post-op Assessment: Report given to PACU RN and Post -op Vital signs reviewed and stable  Post vital signs: Reviewed and stable  Complications: No apparent anesthesia complications

## 2013-09-30 NOTE — Interval H&P Note (Signed)
History and Physical Interval Note:  09/30/2013 12:41 PM  Kelly Fitzgerald  has presented today for surgery, with the diagnosis of CERVICAL INTRAEPITHELIAL NEOPLASIA I and II  The various methods of treatment have been discussed with the patient and family. After consideration of risks, benefits and other options for treatment, the patient has consented to  Procedure(s): CO2 LASER OF CERVIX (N/A) as a surgical intervention .  The patient's history has been reviewed, patient examined, no change in status, stable for surgery.  I have reviewed the patient's chart and labs.  Questions were answered to the patient's satisfaction.     Ok Edwards

## 2013-09-30 NOTE — Op Note (Signed)
   Operative Note  09/30/2013  2:08 PM  PATIENT:  Kelly Fitzgerald  28 y.o. female  PRE-OPERATIVE DIAGNOSIS:  CERVICAL INTRAEPITHELIAL NEOPLASIA I and II  POST-OPERATIVE DIAGNOSIS:  CERVICAL INTRAEPITHELIAL NEOPLASIA I and II  PROCEDURE:  Procedure(s): CO2 LASER OF CERVIX  SURGEON:  Surgeon(s): Ok Edwards, MD  ANESTHESIA:   MAC  FINDINGS: Acetowhite areas of the ectocervix at the 10:00 and 12:00 position transformation zone was visualized entirely with the colposcope. No additional vaginal or vulvar lesions seen  DESCRIPTION OF OPERATION: The patient was taken to the operating room where she underwent MAC anesthesia. A timeout was undertaken to identify the patient and to voice out loud procedure to be undertaken as well as patient's allergies. Patient was then placed in high lithotomy position.Viviann Spare were used to pack around the external genitalia followed by leggings. A titanium coated speculum was introduced into the vagina. Betadine solution was placed on the cervix. 1% lidocaine with 1 100,000 epinephrine was infiltrated into the cervical vaginal stroma at 2, 4, 8, and 10:00 position. Following this acetic as was applied. The above mentioned areas were identified. With the CO2 laser set at 8 W a circumferential marking on the ectocervix several millimeters from the leukoplakic area was performed. The ectocervix was then divided into 4 quadrants and with the CO2 laser the cervical mucosa was ablated to a depth of 3-4 mm. Silvadene cream was applied upon completion of the procedure. Patient tolerated procedure well was transferred to the recovery room stable vital signs and received 30 mg of Toradol IV.   ESTIMATED BLOOD LOSS: Minimal   Intake/Output Summary (Last 24 hours) at 09/30/13 1408 Last data filed at 09/30/13 1335  Gross per 24 hour  Intake    650 ml  Output     64 ml  Net    586 ml     BLOOD ADMINISTERED:none   LOCAL MEDICATIONS USED:  LIDOCAINE 1% with 1 100,000  epinephrine paracervical block total 20 cc  SPECIMEN:  Source of Specimen:  None  DISPOSITION OF SPECIMEN:  N/A  COUNTS:  YES  PLAN OF CARE: Transfer to PACU  [facility] HMD2:08 PMTD@  Note: This dictation was prepared with  Dragon/digital dictation along withSmart phrase technology. Any transcriptional errors that result from this process are unintentional.

## 2013-09-30 NOTE — Discharge Instructions (Signed)
DISCHARGE INSTRUCTIONS: CO2 Laser of cervix The following instructions have been prepared to help you care for yourself upon your return home.  MAY TAKE IBUPROFEN AFTER 7:30PM AS NEEDED FOR PAIN  Personal hygiene:  Use sanitary pads for vaginal drainage, not tampons.  Shower the day after your procedure.  NO tub baths, pools or Jacuzzis for 2-3 weeks.  Wipe front to back after using the bathroom.  Activity and limitations:  Do NOT drive or operate any equipment for 24 hours. The effects of anesthesia are still present and drowsiness may result.  Do NOT rest in bed all day.  Walking is encouraged.  Walk up and down stairs slowly.  You may resume your normal activity in one to two days or as indicated by your physician.  Sexual activity: NO intercourse for at least 2 weeks after the procedure, or as indicated by your physician.  Diet: Eat a light meal as desired this evening. You may resume your usual diet tomorrow.  Return to work: You may resume your work activities in one to two days or as indicated by your doctor.  What to expect after your surgery: Expect to have vaginal bleeding/discharge for 2-3 days and spotting for up to 10 days. It is not unusual to have soreness for up to 1-2 weeks. You may have a slight burning sensation when you urinate for the first day. Mild cramps may continue for a couple of days. You may have a regular period in 2-6 weeks.  Call your doctor for any of the following:  Excessive vaginal bleeding, saturating and changing one pad every hour.  Inability to urinate 6 hours after discharge from hospital.  Pain not relieved by pain medication.  Fever of 100.4 F or greater.  Unusual vaginal discharge or odor.

## 2013-09-30 NOTE — Anesthesia Preprocedure Evaluation (Addendum)
Anesthesia Evaluation  Patient identified by MRN, date of birth, ID band Patient awake    Reviewed: Allergy & Precautions, H&P , NPO status , Patient's Chart, lab work & pertinent test results, reviewed documented beta blocker date and time   History of Anesthesia Complications Negative for: history of anesthetic complications  Airway Mallampati: I      Dental  (+) Teeth Intact,    Pulmonary neg pulmonary ROS,  breath sounds clear to auscultation  Pulmonary exam normal       Cardiovascular Exercise Tolerance: Good negative cardio ROS  Rhythm:regular Rate:Normal     Neuro/Psych  Headaches (rare migraines), negative psych ROS   GI/Hepatic negative GI ROS, Neg liver ROS,   Endo/Other  negative endocrine ROS  Renal/GU negative Renal ROS  Female GU complaint     Musculoskeletal   Abdominal   Peds  Hematology  (+) anemia ,   Anesthesia Other Findings   Reproductive/Obstetrics negative OB ROS                           Anesthesia Physical Anesthesia Plan  ASA: I  Anesthesia Plan: MAC   Post-op Pain Management:    Induction:   Airway Management Planned:   Additional Equipment:   Intra-op Plan:   Post-operative Plan:   Informed Consent: I have reviewed the patients History and Physical, chart, labs and discussed the procedure including the risks, benefits and alternatives for the proposed anesthesia with the patient or authorized representative who has indicated his/her understanding and acceptance.   Dental Advisory Given  Plan Discussed with: CRNA and Surgeon  Anesthesia Plan Comments: (DR FERNANDEZ REQUESTS MAC)       Anesthesia Quick Evaluation

## 2013-09-30 NOTE — H&P (View-Only) (Signed)
Kelly Fitzgerald is an 28 y.o. female. Who presents to the office today for preop consultation. Patient was seen in the office on 08/05/2013 for colposcopic evaluation as a result of her abnormal Pap smear, for her annual gynecological examination made first of this year. Patient stated that many years ago approximately 5 years ago at the [facility] she had an abnormal Pap smear for which colposcopic evaluation and biopsy was done but was only followed with Pap smear afterwards. We have no records.  Pap smear May 2015 here in the office demonstrated the following: Diagnosis  ATYPICAL SQUAMOUS CELLS OF UNDETERMINED SIGNIFICANCE (ASC-US).  ORGANISM(S)  FUNGAL ORGANISMS PRESENT CONSISTENT WITH CANDIDA.  Patient underwent detail colposcopic evaluation please see picture from office visit of 08/05/2013 for specific detail. Pathology report demonstrated the following:  1. Endocervix, curettage - BENIGN ENDOCERVICAL MUCOSA. - DETACHED FRAGMENTS OF LOW GRADE SQUAMOUS INTRAEPITHELIAL LESION, CIN-I (MILD DYSPLASIA). 2. Cervix, biopsy, 1 o'clock - LOW GRADE SQUAMOUS INTRAEPITHELIAL LESION, CIN-I (MILD DYSPLASIA), SEE COMMENT. 3. Cervix, biopsy, 10 o'clock - HIGH GRADE SQUAMOUS INTRAEPITHELIAL LESION, CIN-II (MODERATE DYSPLASIA), WITH ENDOCERVICAL GLANDULAR EXTENSION, SEE COMMENT. Microscopic Comment 2. , 3. The findings correlate with the previous Pap test results  The patient was offered different treatment options the best would be to proceed with CO2 laser ablation of CIN-1 and CIN-2 of the cervix in an outpatient setting.   Pertinent Gynecological History: Menses: Regular Bleeding: Regular cycles Contraception: OCP (estrogen/progesterone) DES exposure: unknown Blood transfusions: none Sexually transmitted diseases: no past history Previous GYN Procedures: no  Last mammogram: not indicated Date: not indicated Last pap: abnormal: See above Date: See above  OB History: G0,  P0   Menstrual History: Menarche age: 49  No LMP recorded.    Past Medical History  Diagnosis Date  . Migraines     No past surgical history on file.  Family History  Problem Relation Age of Onset  . Hypertension Mother   . Cancer Father     prostate  . Ovarian cancer Paternal Grandmother     Social History:  reports that she has never smoked. She has never used smokeless tobacco. She reports that she drinks alcohol. She reports that she does not use illicit drugs.  Allergies:  Allergies  Allergen Reactions  . Amoxicillin Hives  . Erythromycin Nausea And Vomiting  . Penicillins Hives     (Not in a hospital admission)  REVIEW OF SYSTEMS: A ROS was performed and pertinent positives and negatives are included in the history.  GENERAL: No fevers or chills. HEENT: No change in vision, no earache, sore throat or sinus congestion. NECK: No pain or stiffness. CARDIOVASCULAR: No chest pain or pressure. No palpitations. PULMONARY: No shortness of breath, cough or wheeze. GASTROINTESTINAL: No abdominal pain, nausea, vomiting or diarrhea, melena or bright red blood per rectum. GENITOURINARY: No urinary frequency, urgency, hesitancy or dysuria. MUSCULOSKELETAL: No joint or muscle pain, no back pain, no recent trauma. DERMATOLOGIC: No rash, no itching, no lesions. ENDOCRINE: No polyuria, polydipsia, no heat or cold intolerance. No recent change in weight. HEMATOLOGICAL: No anemia or easy bruising or bleeding. NEUROLOGIC: No headache, seizures, numbness, tingling or weakness. PSYCHIATRIC: No depression, no loss of interest in normal activity or change in sleep pattern.     Blood pressure 108/76.  Physical Exam:  HEENT:unremarkable Neck:Supple, midline, no thyroid megaly, no carotid bruits Lungs:  Clear to auscultation no rhonchi's or wheezes Heart:Regular rate and rhythm, no murmurs or gallops Breast Exam: Done earlier  this year normal Abdomen: Soft nontender no rebound or  guarding Pelvic:BUS within normal limits Vagina: No lesions or discharge Cervix: As described above Uterus: Anteverted normal size shape and consistency Adnexa: No masses or tenderness Extremities: No cords, no edema Rectal: Not done   Assessment/Plan: 28 year old gravida 0 para 0 with CIN-1, CIN-2 scheduled to undergo CO2 laser ablation. Risks benefits and pros and cons were discussed with the patient. The additional risks discussed were as follows:                        Patient was counseled as to the risk of surgery to include the following:  1. Infection (prohylactic antibiotics will be administered)  2. DVT/Pulmonary Embolism (prophylactic pneumo compression stockings will be used)  3.Trauma to internal organs requiring additional surgical procedure to repair any injury to     Internal organs requiring perhaps additional hospitalization days.  4.Hemmorhage requiring transfusion and blood products which carry risks such as anaphylactic reaction, hepatitis and AIDS  Patient had received literature information on the procedure scheduled and all her questions were answered and fully accepts all risk.   [facility] HMD8:36 AMTD@Note : This dictation was prepared with  Dragon/digital dictation along withSmart phrase technology. Any transcriptional errors that result from this process are unintentional.      Ok Edwards 09/07/2013, 8:16 AM  Note: This dictation was prepared with  Dragon/digital dictation along withSmart phrase technology. Any transcriptional errors that result from this process are unintentional.

## 2013-09-30 NOTE — Anesthesia Postprocedure Evaluation (Signed)
Anesthesia Post Note  Patient: Kelly Fitzgerald  Procedure(s) Performed: Procedure(s) (LRB): CO2 LASER OF CERVIX (N/A)  Anesthesia type: MAC  Patient location: PACU  Post pain: Pain level controlled  Post assessment: Post-op Vital signs reviewed  Last Vitals:  Filed Vitals:   09/30/13 1445  BP: 110/62  Pulse: 55  Temp: 36.7 C  Resp: 16    Post vital signs: Reviewed  Level of consciousness: sedated  Complications: No apparent anesthesia complications

## 2013-10-01 ENCOUNTER — Encounter (HOSPITAL_COMMUNITY): Payer: Self-pay | Admitting: Gynecology

## 2013-10-21 ENCOUNTER — Encounter: Payer: Self-pay | Admitting: Gynecology

## 2013-10-21 ENCOUNTER — Ambulatory Visit (INDEPENDENT_AMBULATORY_CARE_PROVIDER_SITE_OTHER): Payer: 59 | Admitting: Gynecology

## 2013-10-21 VITALS — BP 116/78

## 2013-10-21 DIAGNOSIS — Z09 Encounter for follow-up examination after completed treatment for conditions other than malignant neoplasm: Secondary | ICD-10-CM

## 2013-10-21 NOTE — Progress Notes (Signed)
   Patient presented to the office today for her 3 week postop visit. Patient status post CO2 laser ablation of cervical dysplasia (CIN 1/CIN-2) on 10/27/2013. Patient is doing well she is asymptomatic. She's on oral contraceptive pill and having normal menstrual cycle. She was offered the flu vaccine today but declined. She is getting married in 1 week.  Exam: Bartholin urethra Skene is within normal limits Vagina: No lesions or discharge Cervical bed almost completely healed. Bimanual exam: Not done Rectal exam: Not done  Assessment/plan: Patient 3 weeks status post CO2 laser ablation of CIN-1/CIN-2 doing well. Patient may resume for normal sexual activity next week. She will return back in 6 months for followup annual exam and Pap smear.

## 2014-01-12 ENCOUNTER — Ambulatory Visit (INDEPENDENT_AMBULATORY_CARE_PROVIDER_SITE_OTHER): Payer: 59 | Admitting: Family Medicine

## 2014-01-12 VITALS — BP 102/74 | HR 75 | Temp 98.0°F | Resp 16 | Ht 66.0 in | Wt 129.2 lb

## 2014-01-12 DIAGNOSIS — Z880 Allergy status to penicillin: Secondary | ICD-10-CM

## 2014-01-12 DIAGNOSIS — Z889 Allergy status to unspecified drugs, medicaments and biological substances status: Secondary | ICD-10-CM

## 2014-01-12 NOTE — Progress Notes (Signed)
Subjective: 28 year old lady who has a history of problems with probable allergies. She has a history of being allergic to penicillin and amoxicillin and GI upset with erythromycin. She has been having problems with itching and not feeling good when she ate meat that it been antibiotic treated. She has been able to handle antibiotic free chicken, but when she handles convincingly raised antibiotic fitted chickens and uses her hands on the raw meat she develops itching of the hands. She has done better on trying to avoid antibiotic fed meats. She went to see an allergist today and was told that she was required to have a referral so she came over here for assessment.  Objective: Ears appear normal. Her eyes are noninjected today. Throat clear. Neck supple without significant nodes. Chest clear to auscultation. Heart regular without murmurs.  Assessment: History of allergies  Plan: Refer to allergist for further assessment.

## 2014-01-12 NOTE — Patient Instructions (Signed)
Go ahead and keep an allergy diary. Live a fairly routine life until your appointment with the allergist. If you have further problems or need to be is seen acutely please return.

## 2014-05-31 ENCOUNTER — Encounter: Payer: Self-pay | Admitting: Women's Health

## 2014-07-29 ENCOUNTER — Ambulatory Visit (INDEPENDENT_AMBULATORY_CARE_PROVIDER_SITE_OTHER): Payer: PRIVATE HEALTH INSURANCE | Admitting: Women's Health

## 2014-07-29 ENCOUNTER — Encounter: Payer: Self-pay | Admitting: Women's Health

## 2014-07-29 ENCOUNTER — Other Ambulatory Visit (HOSPITAL_COMMUNITY)
Admission: RE | Admit: 2014-07-29 | Discharge: 2014-07-29 | Disposition: A | Discharge status: Home / Self Care | Payer: 59 | Source: Ambulatory Visit | Attending: Women's Health | Admitting: Women's Health

## 2014-07-29 VITALS — BP 122/74 | Ht 66.0 in | Wt 135.0 lb

## 2014-07-29 DIAGNOSIS — Z01419 Encounter for gynecological examination (general) (routine) without abnormal findings: Secondary | ICD-10-CM | POA: Diagnosis not present

## 2014-07-29 DIAGNOSIS — N898 Other specified noninflammatory disorders of vagina: Secondary | ICD-10-CM | POA: Diagnosis not present

## 2014-07-29 DIAGNOSIS — Z1151 Encounter for screening for human papillomavirus (HPV): Secondary | ICD-10-CM | POA: Insufficient documentation

## 2014-07-29 LAB — WET PREP FOR TRICH, YEAST, CLUE
Clue Cells Wet Prep HPF POC: NONE SEEN
TRICH WET PREP: NONE SEEN
Yeast Wet Prep HPF POC: NONE SEEN

## 2014-07-29 LAB — CBC WITH DIFFERENTIAL/PLATELET
BASOS ABS: 0.1 10*3/uL (ref 0.0–0.1)
BASOS PCT: 2 % — AB (ref 0–1)
Eosinophils Absolute: 0.2 10*3/uL (ref 0.0–0.7)
Eosinophils Relative: 3 % (ref 0–5)
HCT: 37.9 % (ref 36.0–46.0)
Hemoglobin: 12.3 g/dL (ref 12.0–15.0)
LYMPHS PCT: 36 % (ref 12–46)
Lymphs Abs: 2.1 10*3/uL (ref 0.7–4.0)
MCH: 28 pg (ref 26.0–34.0)
MCHC: 32.5 g/dL (ref 30.0–36.0)
MCV: 86.3 fL (ref 78.0–100.0)
MPV: 9 fL (ref 8.6–12.4)
Monocytes Absolute: 0.4 10*3/uL (ref 0.1–1.0)
Monocytes Relative: 7 % (ref 3–12)
Neutro Abs: 3 10*3/uL (ref 1.7–7.7)
Neutrophils Relative %: 52 % (ref 43–77)
Platelets: 366 10*3/uL (ref 150–400)
RBC: 4.39 MIL/uL (ref 3.87–5.11)
RDW: 13.3 % (ref 11.5–15.5)
WBC: 5.8 10*3/uL (ref 4.0–10.5)

## 2014-07-29 NOTE — Addendum Note (Signed)
Addended by: Kem ParkinsonBARNES, Josip Merolla on: 07/29/2014 02:15 PM   Modules accepted: Orders

## 2014-07-29 NOTE — Addendum Note (Signed)
Addended by: Kem ParkinsonBARNES, Major [place] on: 07/29/2014 01:53 PM   Modules accepted: Orders

## 2014-07-29 NOTE — Patient Instructions (Signed)
First Trimester of Pregnancy The first trimester of pregnancy is from week 1 until the end of week 12 (months 1 through 3). A week after a sperm fertilizes an egg, the egg will implant on the wall of the uterus. This embryo will begin to develop into a baby. Genes from you and your partner are forming the baby. The female genes determine whether the baby is a boy or a girl. At 6-8 weeks, the eyes and face are formed, and the heartbeat can be seen on ultrasound. At the end of 12 weeks, all the baby's organs are formed.  Now that you are pregnant, you will want to do everything you can to have a healthy baby. Two of the most important things are to get good prenatal care and to follow your health care provider's instructions. Prenatal care is all the medical care you receive before the baby's birth. This care will help prevent, find, and treat any problems during the pregnancy and childbirth. BODY CHANGES Your body goes through many changes during pregnancy. The changes vary from woman to woman.   You may gain or lose a couple of pounds at first.  You may feel sick to your stomach (nauseous) and throw up (vomit). If the vomiting is uncontrollable, call your health care provider.  You may tire easily.  You may develop headaches that can be relieved by medicines approved by your health care provider.  You may urinate more often. Painful urination may mean you have a bladder infection.  You may develop heartburn as a result of your pregnancy.  You may develop constipation because certain hormones are causing the muscles that push waste through your intestines to slow down.  You may develop hemorrhoids or swollen, bulging veins (varicose veins).  Your breasts may begin to grow larger and become tender. Your nipples may stick out more, and the tissue that surrounds them (areola) may become darker.  Your gums may bleed and may be sensitive to brushing and flossing.  Dark spots or blotches (chloasma,  mask of pregnancy) may develop on your face. This will likely fade after the baby is born.  Your menstrual periods will stop.  You may have a loss of appetite.  You may develop cravings for certain kinds of food.  You may have changes in your emotions from day to day, such as being excited to be pregnant or being concerned that something may go wrong with the pregnancy and baby.  You may have more vivid and strange dreams.  You may have changes in your hair. These can include thickening of your hair, rapid growth, and changes in texture. Some women also have hair loss during or after pregnancy, or hair that feels dry or thin. Your hair will most likely return to normal after your baby is born. WHAT TO EXPECT AT YOUR PRENATAL VISITS During a routine prenatal visit:  You will be weighed to make sure you and the baby are growing normally.  Your blood pressure will be taken.  Your abdomen will be measured to track your baby's growth.  The fetal heartbeat will be listened to starting around week 10 or 12 of your pregnancy.  Test results from any previous visits will be discussed. Your health care provider may ask you:  How you are feeling.  If you are feeling the baby move.  If you have had any abnormal symptoms, such as leaking fluid, bleeding, severe headaches, or abdominal cramping.  If you have any questions. Other tests   that may be performed during your first trimester include:  Blood tests to find your blood type and to check for the presence of any previous infections. They will also be used to check for low iron levels (anemia) and Rh antibodies. Later in the pregnancy, blood tests for diabetes will be done along with other tests if problems develop.  Urine tests to check for infections, diabetes, or protein in the urine.  An ultrasound to confirm the proper growth and development of the baby.  An amniocentesis to check for possible genetic problems.  Fetal screens for  spina bifida and Down syndrome.  You may need other tests to make sure you and the baby are doing well. HOME CARE INSTRUCTIONS  Medicines  Follow your health care provider's instructions regarding medicine use. Specific medicines may be either safe or unsafe to take during pregnancy.  Take your prenatal vitamins as directed.  If you develop constipation, try taking a stool softener if your health care provider approves. Diet  Eat regular, well-balanced meals. Choose a variety of foods, such as meat or vegetable-based protein, fish, milk and low-fat dairy products, vegetables, fruits, and whole grain breads and cereals. Your health care provider will help you determine the amount of weight gain that is right for you.  Avoid raw meat and uncooked cheese. These carry germs that can cause birth defects in the baby.  Eating four or five small meals rather than three large meals a day may help relieve nausea and vomiting. If you start to feel nauseous, eating a few soda crackers can be helpful. Drinking liquids between meals instead of during meals also seems to help nausea and vomiting.  If you develop constipation, eat more high-fiber foods, such as fresh vegetables or fruit and whole grains. Drink enough fluids to keep your urine clear or pale yellow. Activity and Exercise  Exercise only as directed by your health care provider. Exercising will help you:  Control your weight.  Stay in shape.  Be prepared for labor and delivery.  Experiencing pain or cramping in the lower abdomen or low back is a good sign that you should stop exercising. Check with your health care provider before continuing normal exercises.  Try to avoid standing for long periods of time. Move your legs often if you must stand in one place for a long time.  Avoid heavy lifting.  Wear low-heeled shoes, and practice good posture.  You may continue to have sex unless your health care provider directs you  otherwise. Relief of Pain or Discomfort  Wear a good support bra for breast tenderness.   Take warm sitz baths to soothe any pain or discomfort caused by hemorrhoids. Use hemorrhoid cream if your health care provider approves.   Rest with your legs elevated if you have leg cramps or low back pain.  If you develop varicose veins in your legs, wear support hose. Elevate your feet for 15 minutes, 3-4 times a day. Limit salt in your diet. Prenatal Care  Schedule your prenatal visits by the twelfth week of pregnancy. They are usually scheduled monthly at first, then more often in the last 2 months before delivery.  Write down your questions. Take them to your prenatal visits.  Keep all your prenatal visits as directed by your health care provider. Safety  Wear your seat belt at all times when driving.  Make a list of emergency phone numbers, including numbers for family, friends, the hospital, and police and fire departments. General Tips    Ask your health care provider for a referral to a local prenatal education class. Begin classes no later than at the beginning of month 6 of your pregnancy.  Ask for help if you have counseling or nutritional needs during pregnancy. Your health care provider can offer advice or refer you to specialists for help with various needs.  Do not use hot tubs, steam rooms, or saunas.  Do not douche or use tampons or scented sanitary pads.  Do not cross your legs for long periods of time.  Avoid cat litter boxes and soil used by cats. These carry germs that can cause birth defects in the baby and possibly loss of the fetus by miscarriage or stillbirth.  Avoid all smoking, herbs, alcohol, and medicines not prescribed by your health care provider. Chemicals in these affect the formation and growth of the baby.  Schedule a dentist appointment. At home, brush your teeth with a soft toothbrush and be gentle when you floss. SEEK MEDICAL CARE IF:   You have  dizziness.  You have mild pelvic cramps, pelvic pressure, or nagging pain in the abdominal area.  You have persistent nausea, vomiting, or diarrhea.  You have a bad smelling vaginal discharge.  You have pain with urination.  You notice increased swelling in your face, hands, legs, or ankles. SEEK IMMEDIATE MEDICAL CARE IF:   You have a fever.  You are leaking fluid from your vagina.  You have spotting or bleeding from your vagina.  You have severe abdominal cramping or pain.  You have rapid weight gain or loss.  You vomit blood or material that looks like coffee grounds.  You are exposed to German measles and have never had them.  You are exposed to fifth disease or chickenpox.  You develop a severe headache.  You have shortness of breath.  You have any kind of trauma, such as from a fall or a car accident. Document Released: 01/08/2001 Document Revised: 05/31/2013 Document Reviewed: 11/24/2012 Exit[employer] Patient Information 2015 [employer]. This information is not intended to replace advice given to you by your health care provider. Make sure you discuss any questions you have with your health care provider.  

## 2014-07-29 NOTE — Progress Notes (Signed)
Kelly Roselle LocusM Fitzgerald 10/09/1985 [id number]    History:    Presents for annual exam. Complains of mild white discharge for one month. Currently taking course of antibiotics for hand abscess/prescribed PRN Diflucan. Sexually active/no condom use/monthly periods. Desires children/Recently stopped OCPs. 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 Did not receive Gardasil.   Past medical history, past surgical history, family history and social history were all reviewed and documented in the EPIC chart. Recently married. Recent job change. 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 Family history of Cancer and HTN  ROS:  A ROS was performed and pertinent positives and negatives are included.  Exam:  Filed Vitals:   07/29/14 1215  BP: 122/74    General appearance:  Normal Thyroid:  Symmetrical, normal in size, without palpable masses or nodularity. Respiratory  Auscultation:  Clear without wheezing or rhonchi Cardiovascular  Auscultation:  Regular rate, without rubs, murmurs or gallops  Edema/varicosities:  Not grossly evident Abdominal  Soft,nontender, without masses, guarding or rebound.  Liver/spleen:  No organomegaly noted  Hernia:  None appreciated  Skin  Inspection:  Grossly normal   Breasts: Examined lying and sitting.     Right: Without masses, retractions, discharge or axillary adenopathy.     Left: Without masses, retractions, discharge or axillary adenopathy. Gentitourinary   Inguinal/mons:  Normal without inguinal adenopathy  External genitalia:  Normal  BUS/Urethra/Skene's glands:  Normal  Vagina:  Normal  Cervix:  Normal  Uterus:  Anteverted, normal in size, shape and contour.  Midline and mobile  Adnexa/parametria:     Rt: Without masses or tenderness.   Lt: Without masses or tenderness.  Anus and perineum: Normal  Digital rectal exam: Normal sphincter tone without palpated masses or tenderness  Assessment/Plan:  29 y.o. MWF G0 for annual exam.  Complains of mild  white discharge.  Vaginal discharge - wet prep negative monthly cycle/no condom/co contraception use.  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   Plan: Condom use for one month reviewed for current antibiotic doxycycline. Fertility and ovulatoy cycle discussed.   Prenatal diet/nutrition/vitamins discussed. Take Diflucan PRN if discharge symptoms worsen. Healthy diet/regular exercise/SBEs encouraged. PAP with HR HPV typing, CBC, UA. Return to office for missed cycle for viability ultrasound. Aware we no longer deliver.  Harrington ChallengerYOUNG,Rael Yo J WHNP, 1:28 PM 07/29/2014

## 2014-07-30 LAB — URINALYSIS W MICROSCOPIC + REFLEX CULTURE
Bacteria, UA: NONE SEEN
Bilirubin Urine: NEGATIVE
Casts: NONE SEEN
Crystals: NONE SEEN
Glucose, UA: NEGATIVE mg/dL
Hgb urine dipstick: NEGATIVE
KETONES UR: NEGATIVE mg/dL
LEUKOCYTES UA: NEGATIVE
NITRITE: NEGATIVE
Protein, ur: NEGATIVE mg/dL
SPECIFIC GRAVITY, URINE: 1.008 (ref 1.005–1.030)
Squamous Epithelial / LPF: NONE SEEN
UROBILINOGEN UA: 0.2 mg/dL (ref 0.0–1.0)
pH: 6 (ref 5.0–8.0)

## 2014-08-04 LAB — CYTOLOGY - PAP

## 2015-04-03 ENCOUNTER — Telehealth: Payer: Self-pay | Admitting: Allergy and Immunology

## 2015-04-03 NOTE — Telephone Encounter (Signed)
Please call pt back on work phone at #[phone] regarding bill she received after 10 day notice given. Stated she had issues with her [employer] and wanted to know if claim just wasn't covered?

## 2015-04-04 NOTE — Telephone Encounter (Signed)
LM THAT SHE HAD NEEDED REFERRAL - NO PMT FROM [employer]

## 2015-04-19 ENCOUNTER — Telehealth: Payer: Self-pay | Admitting: Allergy and Immunology

## 2015-04-19 NOTE — Telephone Encounter (Signed)
She says that she has spoken with Olegario MessierKathy before after receiving a letter and wants to know if she can be set up on a payment plan. If you will please look at this and give her a call back.

## 2015-04-19 NOTE — Telephone Encounter (Signed)
Will pay at least $40/mo

## 2015-10-25 ENCOUNTER — Ambulatory Visit (INDEPENDENT_AMBULATORY_CARE_PROVIDER_SITE_OTHER): Payer: 59 | Admitting: Women's Health

## 2015-10-25 ENCOUNTER — Encounter: Payer: Self-pay | Admitting: Women's Health

## 2015-10-25 VITALS — BP 119/78 | Ht 66.0 in | Wt 144.3 lb

## 2015-10-25 DIAGNOSIS — N871 Moderate cervical dysplasia: Secondary | ICD-10-CM | POA: Diagnosis not present

## 2015-10-25 DIAGNOSIS — Z01419 Encounter for gynecological examination (general) (routine) without abnormal findings: Secondary | ICD-10-CM

## 2015-10-25 LAB — CBC WITH DIFFERENTIAL/PLATELET
Basophils Absolute: 0 cells/uL (ref 0–200)
Basophils Relative: 0 %
EOS PCT: 6 %
Eosinophils Absolute: 438 cells/uL (ref 15–500)
HCT: 38.2 % (ref 35.0–45.0)
Hemoglobin: 12.5 g/dL (ref 11.7–15.5)
Lymphocytes Relative: 34 %
Lymphs Abs: 2482 cells/uL (ref 850–3900)
MCH: 28.4 pg (ref 27.0–33.0)
MCHC: 32.7 g/dL (ref 32.0–36.0)
MCV: 86.8 fL (ref 80.0–100.0)
MONOS PCT: 7 %
MPV: 9.2 fL (ref 7.5–12.5)
Monocytes Absolute: 511 cells/uL (ref 200–950)
NEUTROS PCT: 53 %
Neutro Abs: 3869 cells/uL (ref 1500–7800)
PLATELETS: 337 10*3/uL (ref 140–400)
RBC: 4.4 MIL/uL (ref 3.80–5.10)
RDW: 13.6 % (ref 11.0–15.0)
WBC: 7.3 10*3/uL (ref 3.8–10.8)

## 2015-10-25 NOTE — Progress Notes (Signed)
Circe Roselle LocusM Mainor 03/01/1985 [id number]    History:    Presents for annual exam.  Regular monthly cycle using withdrawal for contraception, hoping to conceive after January. 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 Pap 2016 normal with negative HR HPV.  Past medical history, past surgical history, family history and social history were all reviewed and documented in the EPIC chart. Works for [employer], hoping to get a day job in January with the company. Numerous family members with hypertension.  ROS:  A ROS was performed and pertinent positives and negatives are included.  Exam:  Vitals:   10/25/15 0941  BP: 119/78  Weight: 144 lb 4.8 oz (65.5 kg)  Height: 5\' 6"  (1.676 m)   Body mass index is 23.29 kg/m.   General appearance:  Normal Thyroid:  Symmetrical, normal in size, without palpable masses or nodularity. Respiratory  Auscultation:  Clear without wheezing or rhonchi Cardiovascular  Auscultation:  Regular rate, without rubs, murmurs or gallops  Edema/varicosities:  Not grossly evident Abdominal  Soft,nontender, without masses, guarding or rebound.  Liver/spleen:  No organomegaly noted  Hernia:  None appreciated  Skin  Inspection:  Grossly normal   Breasts: Examined lying and sitting.     Right: Without masses, retractions, discharge or axillary adenopathy.     Left: Without masses, retractions, discharge or axillary adenopathy. Gentitourinary   Inguinal/mons:  Normal without inguinal adenopathy  External genitalia:  Normal  BUS/Urethra/Skene's glands:  Normal  Vagina:  Normal  Cervix:  Normal  Uterus:   normal in size, shape and contour.  Midline and mobile  Adnexa/parametria:     Rt: Without masses or tenderness.   Lt: Without masses or tenderness.  Anus and perineum: Normal  Digital rectal exam: Normal sphincter tone without palpated masses or tenderness  Assessment/Plan:  30 y.o. MWF G0  for annual exam no complaints.  Regular monthly  cycle/withdrawal 2015 CIN-2 CO2 ablation-normal Paps after  Plan: Contraception options reviewed, reports pregnancy okay. Safe pregnancy behaviors reviewed, prenatal vitamin daily encouraged. SBE's, continue healthy lifestyle with regular exercise and diet. CBC, rubella titer, UA, Pap.  Harrington ChallengerYOUNG,Toye Rouillard J [facility] Healthcare[facility]NP, 10:37 AM 10/25/2015

## 2015-10-25 NOTE — Assessment & Plan Note (Signed)
09/2013 CIN-2  CO2  ablation

## 2015-10-25 NOTE — Patient Instructions (Signed)
Health Maintenance, Female Adopting a healthy lifestyle and getting preventive care can go a long way to promote health and wellness. Talk with your health care provider about what schedule of regular examinations is right for you. This is a good chance for you to check in with your provider about disease prevention and staying healthy. In between checkups, there are plenty of things you can do on your own. Experts have done a lot of research about which lifestyle changes and preventive measures are most likely to keep you healthy. Ask your health care provider for more information. WEIGHT AND DIET  Eat a healthy diet  Be sure to include plenty of vegetables, fruits, low-fat dairy products, and lean protein.  Do not eat a lot of foods high in solid fats, added sugars, or salt.  Get regular exercise. This is one of the most important things you can do for your health.  Most adults should exercise for at least 150 minutes each week. The exercise should increase your heart rate and make you sweat (moderate-intensity exercise).  Most adults should also do strengthening exercises at least twice a week. This is in addition to the moderate-intensity exercise.  Maintain a healthy weight  Body mass index (BMI) is a measurement that can be used to identify possible weight problems. It estimates body fat based on height and weight. Your health care provider can help determine your BMI and help you achieve or maintain a healthy weight.  For females 20 years of age and older:   A BMI below 18.5 is considered underweight.  A BMI of 18.5 to 24.9 is normal.  A BMI of 25 to 29.9 is considered overweight.  A BMI of 30 and above is considered obese.  Watch levels of cholesterol and blood lipids  You should start having your blood tested for lipids and cholesterol at 32 years of age, then have this test every 5 years.  You may need to have your cholesterol levels checked more often if:  Your lipid  or cholesterol levels are high.  You are older than 32 years of age.  You are at high risk for heart disease.  CANCER SCREENING   Lung Cancer  Lung cancer screening is recommended for adults 55-80 years old who are at high risk for lung cancer because of a history of smoking.  A yearly low-dose CT scan of the lungs is recommended for people who:  Currently smoke.  Have quit within the past 15 years.  Have at least a 30-pack-year history of smoking. A pack year is smoking an average of one pack of cigarettes a day for 1 year.  Yearly screening should continue until it has been 15 years since you quit.  Yearly screening should stop if you develop a health problem that would prevent you from having lung cancer treatment.  Breast Cancer  Practice breast self-awareness. This means understanding how your breasts normally appear and feel.  It also means doing regular breast self-exams. Let your health care provider know about any changes, no matter how small.  If you are in your 20s or 30s, you should have a clinical breast exam (CBE) by a health care provider every 1-3 years as part of a regular health exam.  If you are 40 or older, have a CBE every year. Also consider having a breast X-ray (mammogram) every year.  If you have a family history of breast cancer, talk to your health care provider about genetic screening.  If you   are at high risk for breast cancer, talk to your health care provider about having an MRI and a mammogram every year.  Breast cancer gene (BRCA) assessment is recommended for women who have family members with BRCA-related cancers. BRCA-related cancers include:  Breast.  Ovarian.  Tubal.  Peritoneal cancers.  Results of the assessment will determine the need for genetic counseling and BRCA1 and BRCA2 testing. Cervical Cancer Your health care provider may recommend that you be screened regularly for cancer of the pelvic organs (ovaries, uterus, and  vagina). This screening involves a pelvic examination, including checking for microscopic changes to the surface of your cervix (Pap test). You may be encouraged to have this screening done every 3 years, beginning at age 21.  For women ages 30-65, health care providers may recommend pelvic exams and Pap testing every 3 years, or they may recommend the Pap and pelvic exam, combined with testing for human papilloma virus (HPV), every 5 years. Some types of HPV increase your risk of cervical cancer. Testing for HPV may also be done on women of any age with unclear Pap test results.  Other health care providers may not recommend any screening for nonpregnant women who are considered low risk for pelvic cancer and who do not have symptoms. Ask your health care provider if a screening pelvic exam is right for you.  If you have had past treatment for cervical cancer or a condition that could lead to cancer, you need Pap tests and screening for cancer for at least 20 years after your treatment. If Pap tests have been discontinued, your risk factors (such as having a new sexual partner) need to be reassessed to determine if screening should resume. Some women have medical problems that increase the chance of getting cervical cancer. In these cases, your health care provider may recommend more frequent screening and Pap tests. Colorectal Cancer  This type of cancer can be detected and often prevented.  Routine colorectal cancer screening usually begins at 32 years of age and continues through 32 years of age.  Your health care provider may recommend screening at an earlier age if you have risk factors for colon cancer.  Your health care provider may also recommend using home test kits to check for hidden blood in the stool.  A small camera at the end of a tube can be used to examine your colon directly (sigmoidoscopy or colonoscopy). This is done to check for the earliest forms of colorectal  cancer.  Routine screening usually begins at age 50.  Direct examination of the colon should be repeated every 5-10 years through 32 years of age. However, you may need to be screened more often if early forms of precancerous polyps or small growths are found. Skin Cancer  Check your skin from head to toe regularly.  Tell your health care provider about any new moles or changes in moles, especially if there is a change in a mole's shape or color.  Also tell your health care provider if you have a mole that is larger than the size of a pencil eraser.  Always use sunscreen. Apply sunscreen liberally and repeatedly throughout the day.  Protect yourself by wearing long sleeves, pants, a wide-brimmed hat, and sunglasses whenever you are outside. HEART DISEASE, DIABETES, AND HIGH BLOOD PRESSURE   High blood pressure causes heart disease and increases the risk of stroke. High blood pressure is more likely to develop in:  People who have blood pressure in the high end   of the normal range (130-139/85-89 mm Hg).  People who are overweight or obese.  People who are African American.  If you are 38-23 years of age, have your blood pressure checked every 3-5 years. If you are 61 years of age or older, have your blood pressure checked every year. You should have your blood pressure measured twice--once when you are at a hospital or clinic, and once when you are not at a hospital or clinic. Record the average of the two measurements. To check your blood pressure when you are not at a hospital or clinic, you can use:  An automated blood pressure machine at a pharmacy.  A home blood pressure monitor.  If you are between 45 years and 39 years old, ask your health care provider if you should take aspirin to prevent strokes.  Have regular diabetes screenings. This involves taking a blood sample to check your fasting blood sugar level.  If you are at a normal weight and have a low risk for diabetes,  have this test once every three years after 32 years of age.  If you are overweight and have a high risk for diabetes, consider being tested at a younger age or more often. PREVENTING INFECTION  Hepatitis B  If you have a higher risk for hepatitis B, you should be screened for this virus. You are considered at high risk for hepatitis B if:  You were born in a country where hepatitis B is common. Ask your health care provider which countries are considered high risk.  Your parents were born in a high-risk country, and you have not been immunized against hepatitis B (hepatitis B vaccine).  You have HIV or AIDS.  You use needles to inject street drugs.  You live with someone who has hepatitis B.  You have had sex with someone who has hepatitis B.  You get hemodialysis treatment.  You take certain medicines for conditions, including cancer, organ transplantation, and autoimmune conditions. Hepatitis C  Blood testing is recommended for:  Everyone born from [birth year] through 1967.  Anyone with known risk factors for hepatitis C. Sexually transmitted infections (STIs)  You should be screened for sexually transmitted infections (STIs) including gonorrhea and chlamydia if:  You are sexually active and are younger than 32 years of age.  You are older than 32 years of age and your health care provider tells you that you are at risk for this type of infection.  Your sexual activity has changed since you were last screened and you are at an increased risk for chlamydia or gonorrhea. Ask your health care provider if you are at risk.  If you do not have HIV, but are at risk, it may be recommended that you take a prescription medicine daily to prevent HIV infection. This is called pre-exposure prophylaxis (PrEP). You are considered at risk if:  You are sexually active and do not regularly use condoms or know the HIV status of your partner(s).  You take drugs by injection.  You are sexually  active with a partner who has HIV. Talk with your health care provider about whether you are at high risk of being infected with HIV. If you choose to begin PrEP, you should first be tested for HIV. You should then be tested every 3 months for as long as you are taking PrEP.  PREGNANCY   If you are premenopausal and you may become pregnant, ask your health care provider about preconception counseling.  If you may  become pregnant, take 400 to 800 micrograms (mcg) of folic acid every day.  If you want to prevent pregnancy, talk to your health care provider about birth control (contraception). OSTEOPOROSIS AND MENOPAUSE   Osteoporosis is a disease in which the bones lose minerals and strength with aging. This can result in serious bone fractures. Your risk for osteoporosis can be identified using a bone density scan.  If you are 61 years of age or older, or if you are at risk for osteoporosis and fractures, ask your health care provider if you should be screened.  Ask your health care provider whether you should take a calcium or vitamin D supplement to lower your risk for osteoporosis.  Menopause may have certain physical symptoms and risks.  Hormone replacement therapy may reduce some of these symptoms and risks. Talk to your health care provider about whether hormone replacement therapy is right for you.  HOME CARE INSTRUCTIONS   Schedule regular health, dental, and eye exams.  Stay current with your immunizations.   Do not use any tobacco products including cigarettes, chewing tobacco, or electronic cigarettes.  If you are pregnant, do not drink alcohol.  If you are breastfeeding, limit how much and how often you drink alcohol.  Limit alcohol intake to no more than 1 drink per day for nonpregnant women. One drink equals 12 ounces of beer, 5 ounces of wine, or 1 ounces of hard liquor.  Do not use street drugs.  Do not share needles.  Ask your health care provider for help if  you need support or information about quitting drugs.  Tell your health care provider if you often feel depressed.  Tell your health care provider if you have ever been abused or do not feel safe at home.   This information is not intended to replace advice given to you by your health care provider. Make sure you discuss any questions you have with your health care provider.   Document Released: 07/30/2010 Document Revised: 02/04/2014 Document Reviewed: 12/16/2012 Elsevier Interactive Patient Education [employer].

## 2015-10-26 LAB — URINALYSIS W MICROSCOPIC + REFLEX CULTURE
Bilirubin Urine: NEGATIVE
CRYSTALS: NONE SEEN [HPF]
Casts: NONE SEEN [LPF]
Glucose, UA: NEGATIVE
HGB URINE DIPSTICK: NEGATIVE
Ketones, ur: NEGATIVE
Nitrite: NEGATIVE
Protein, ur: NEGATIVE
SPECIFIC GRAVITY, URINE: 1.018 (ref 1.001–1.035)
Yeast: NONE SEEN [HPF]
pH: 5.5 (ref 5.0–8.0)

## 2015-10-26 LAB — PAP IG W/ RFLX HPV ASCU

## 2015-10-26 LAB — RUBELLA SCREEN: RUBELLA: 4.41 {index} — AB (ref <0.90)

## 2015-10-28 LAB — URINE CULTURE

## 2016-01-29 NOTE — L&D Delivery Note (Signed)
Operative Delivery Note At 7:41 PM a viable female was delivered via Vaginal, Spontaneous.  Presentation: vertex; Position: Occiput,, Anterior; Station: +3.  After about 1.5 hrs of second stage with maternal pushing form +2 station, head on perineum with maternal pushing for additional 30- 40 min, I was dressed the entire time. After about 40 min with head though pelvis but not delivered due to maternal tissue, and no further descnet, discussed with pt recommedation for episiotomy. I was also concerned about shoulder dystocia and had adequate assistance at that time and knew we may need the additional room for dystocia maneuvers. L mediolateral episiotomy cut and with next contaction over 3 pushes, delivery of head. No Nuchal. Turtle sign and shoulders did not follow. Dystocia called. McRoberts and Suprapubic applied almost simultaneously. With suprapubic, anterior, L, shoulder dropped under pubic bone and baby delivered easily. Total time of dystocia 10 seconds.   Delivery of the head: 12/17/2016  7:40 PM First maneuver: 12/17/2016  7:40 PM, Suprapubic Pressure Second maneuver: 12/17/2016  7:41 PM, McRoberts Third maneuver: ,   Fourth maneuver: ,   Fifth maneuver: ,   Sixth maneuver: ,    Verbal consent: unable to obtain verbal consent due to rapid need for maneuvers.  APGAR: ,pending ; weight  .  pending Placenta status: , .spontaneous, intace   Cord:  3VC with the following complications: .  Cord pH: n/a  Anesthesia:  epidural Episiotomy: L mediolateral Lacerations:  None additional Suture Repair: 2.0 vicryl rapide additional deep layer with 3 interrupted figure of 8 sutures Est. Blood Loss (mL):  500. Methergine given due to excessive bleeding. Controlled with meds and fundal massage.   Mom to postpartum.  Baby to Couplet care / Skin to Skin.  Lendon ColonelKelly A Deloyd Handy 12/17/2016, 8:07 PM

## 2016-04-16 ENCOUNTER — Ambulatory Visit (INDEPENDENT_AMBULATORY_CARE_PROVIDER_SITE_OTHER): Payer: 59 | Admitting: Women's Health

## 2016-04-16 ENCOUNTER — Encounter: Payer: Self-pay | Admitting: Women's Health

## 2016-04-16 VITALS — BP 115/81 | Ht 66.0 in | Wt 142.2 lb

## 2016-04-16 DIAGNOSIS — O3680X0 Pregnancy with inconclusive fetal viability, not applicable or unspecified: Secondary | ICD-10-CM

## 2016-04-16 DIAGNOSIS — Z3201 Encounter for pregnancy test, result positive: Secondary | ICD-10-CM | POA: Diagnosis not present

## 2016-04-16 LAB — PREGNANCY, URINE: Preg Test, Ur: POSITIVE — AB

## 2016-04-16 NOTE — Patient Instructions (Signed)
First Trimester of Pregnancy The first trimester of pregnancy is from week 1 until the end of week 13 (months 1 through 3). A week after a sperm fertilizes an egg, the egg will implant on the wall of the uterus. This embryo will begin to develop into a baby. Genes from you and your partner will form the baby. The female genes will determine whether the baby will be a boy or a girl. At 6-8 weeks, the eyes and face will be formed, and the heartbeat can be seen on ultrasound. At the end of 12 weeks, all the baby's organs will be formed. Now that you are pregnant, you will want to do everything you can to have a healthy baby. Two of the most important things are to get good prenatal care and to follow your health care provider's instructions. Prenatal care is all the medical care you receive before the baby's birth. This care will help prevent, find, and treat any problems during the pregnancy and childbirth. Body changes during your first trimester Your body goes through many changes during pregnancy. The changes vary from woman to woman.  You may gain or lose a couple of pounds at first.  You may feel sick to your stomach (nauseous) and you may throw up (vomit). If the vomiting is uncontrollable, call your health care provider.  You may tire easily.  You may develop headaches that can be relieved by medicines. All medicines should be approved by your health care provider.  You may urinate more often. Painful urination may mean you have a bladder infection.  You may develop heartburn as a result of your pregnancy.  You may develop constipation because certain hormones are causing the muscles that push stool through your intestines to slow down.  You may develop hemorrhoids or swollen veins (varicose veins).  Your breasts may begin to grow larger and become tender. Your nipples may stick out more, and the tissue that surrounds them (areola) may become darker.  Your gums may bleed and may be  sensitive to brushing and flossing.  Dark spots or blotches (chloasma, mask of pregnancy) may develop on your face. This will likely fade after the baby is born.  Your menstrual periods will stop.  You may have a loss of appetite.  You may develop cravings for certain kinds of food.  You may have changes in your emotions from day to day, such as being excited to be pregnant or being concerned that something may go wrong with the pregnancy and baby.  You may have more vivid and strange dreams.  You may have changes in your hair. These can include thickening of your hair, rapid growth, and changes in texture. Some women also have hair loss during or after pregnancy, or hair that feels dry or thin. Your hair will most likely return to normal after your baby is born.  What to expect at prenatal visits During a routine prenatal visit:  You will be weighed to make sure you and the baby are growing normally.  Your blood pressure will be taken.  Your abdomen will be measured to track your baby's growth.  The fetal heartbeat will be listened to between weeks 10 and 14 of your pregnancy.  Test results from any previous visits will be discussed.  Your health care provider may ask you:  How you are feeling.  If you are feeling the baby move.  If you have had any abnormal symptoms, such as leaking fluid, bleeding, severe headaches,   or abdominal cramping.  If you are using any tobacco products, including cigarettes, chewing tobacco, and electronic cigarettes.  If you have any questions.  Other tests that may be performed during your first trimester include:  Blood tests to find your blood type and to check for the presence of any previous infections. The tests will also be used to check for low iron levels (anemia) and protein on red blood cells (Rh antibodies). Depending on your risk factors, or if you previously had diabetes during pregnancy, you may have tests to check for high blood  sugar that affects pregnant women (gestational diabetes).  Urine tests to check for infections, diabetes, or protein in the urine.  An ultrasound to confirm the proper growth and development of the baby.  Fetal screens for spinal cord problems (spina bifida) and Down syndrome.  HIV (human immunodeficiency virus) testing. Routine prenatal testing includes screening for HIV, unless you choose not to have this test.  You may need other tests to make sure you and the baby are doing well.  Follow these instructions at home: Medicines  Follow your health care provider's instructions regarding medicine use. Specific medicines may be either safe or unsafe to take during pregnancy.  Take a prenatal vitamin that contains at least 600 micrograms (mcg) of folic acid.  If you develop constipation, try taking a stool softener if your health care provider approves. Eating and drinking  Eat a balanced diet that includes fresh fruits and vegetables, whole grains, good sources of protein such as meat, eggs, or tofu, and low-fat dairy. Your health care provider will help you determine the amount of weight gain that is right for you.  Avoid raw meat and uncooked cheese. These carry germs that can cause birth defects in the baby.  Eating four or five small meals rather than three large meals a day may help relieve nausea and vomiting. If you start to feel nauseous, eating a few soda crackers can be helpful. Drinking liquids between meals, instead of during meals, also seems to help ease nausea and vomiting.  Limit foods that are high in fat and processed sugars, such as fried and sweet foods.  To prevent constipation: ? Eat foods that are high in fiber, such as fresh fruits and vegetables, whole grains, and beans. ? Drink enough fluid to keep your urine clear or pale yellow. Activity  Exercise only as directed by your health care provider. Most women can continue their usual exercise routine during  pregnancy. Try to exercise for 30 minutes at least 5 days a week. Exercising will help you: ? Control your weight. ? Stay in shape. ? Be prepared for labor and delivery.  Experiencing pain or cramping in the lower abdomen or lower back is a good sign that you should stop exercising. Check with your health care provider before continuing with normal exercises.  Try to avoid standing for long periods of time. Move your legs often if you must stand in one place for a long time.  Avoid heavy lifting.  Wear low-heeled shoes and practice good posture.  You may continue to have sex unless your health care provider tells you not to. Relieving pain and discomfort  Wear a good support bra to relieve breast tenderness.  Take warm sitz baths to soothe any pain or discomfort caused by hemorrhoids. Use hemorrhoid cream if your health care provider approves.  Rest with your legs elevated if you have leg cramps or low back pain.  If you develop   varicose veins in your legs, wear support hose. Elevate your feet for 15 minutes, 3-4 times a day. Limit salt in your diet. Prenatal care  Schedule your prenatal visits by the twelfth week of pregnancy. They are usually scheduled monthly at first, then more often in the last 2 months before delivery.  Write down your questions. Take them to your prenatal visits.  Keep all your prenatal visits as told by your health care provider. This is important. Safety  Wear your seat belt at all times when driving.  Make a list of emergency phone numbers, including numbers for family, friends, the hospital, and police and fire departments. General instructions  Ask your health care provider for a referral to a local prenatal education class. Begin classes no later than the beginning of month 6 of your pregnancy.  Ask for help if you have counseling or nutritional needs during pregnancy. Your health care provider can offer advice or refer you to specialists for help  with various needs.  Do not use hot tubs, steam rooms, or saunas.  Do not douche or use tampons or scented sanitary pads.  Do not cross your legs for long periods of time.  Avoid cat litter boxes and soil used by cats. These carry germs that can cause birth defects in the baby and possibly loss of the fetus by miscarriage or stillbirth.  Avoid all smoking, herbs, alcohol, and medicines not prescribed by your health care provider. Chemicals in these products affect the formation and growth of the baby.  Do not use any products that contain nicotine or tobacco, such as cigarettes and e-cigarettes. If you need help quitting, ask your health care provider. You may receive counseling support and other resources to help you quit.  Schedule a dentist appointment. At home, brush your teeth with a soft toothbrush and be gentle when you floss. Contact a health care provider if:  You have dizziness.  You have mild pelvic cramps, pelvic pressure, or nagging pain in the abdominal area.  You have persistent nausea, vomiting, or diarrhea.  You have a bad smelling vaginal discharge.  You have pain when you urinate.  You notice increased swelling in your face, hands, legs, or ankles.  You are exposed to fifth disease or chickenpox.  You are exposed to German measles (rubella) and have never had it. Get help right away if:  You have a fever.  You are leaking fluid from your vagina.  You have spotting or bleeding from your vagina.  You have severe abdominal cramping or pain.  You have rapid weight gain or loss.  You vomit blood or material that looks like coffee grounds.  You develop a severe headache.  You have shortness of breath.  You have any kind of trauma, such as from a fall or a car accident. Summary  The first trimester of pregnancy is from week 1 until the end of week 13 (months 1 through 3).  Your body goes through many changes during pregnancy. The changes vary from  woman to woman.  You will have routine prenatal visits. During those visits, your health care provider will examine you, discuss any test results you may have, and talk with you about how you are feeling. This information is not intended to replace advice given to you by your health care provider. Make sure you discuss any questions you have with your health care provider. Document Released: 01/08/2001 Document Revised: 12/27/2015 Document Reviewed: 12/27/2015 Elsevier Interactive Patient Education  2017 [employer]   Inc[employer]  

## 2016-04-16 NOTE — Progress Notes (Signed)
Presents with home positive UPT. LMP early February approximately 03/09/2016 normal cycle. Desired pregnancy. Denies any abdominal pain, vaginal discharge, bleeding, nausea. Has no known health problems.  Exam: Appears well. Positive UPT  Early pregnancy probable 5 weeks  Plan: Schedule viability/dating ultrasound first week of April. Aware we no longer deliver. Safe pregnancy behaviors reviewed.

## 2016-04-29 ENCOUNTER — Ambulatory Visit: Payer: 59 | Admitting: Women's Health

## 2016-04-29 ENCOUNTER — Other Ambulatory Visit: Payer: 59

## 2016-05-06 ENCOUNTER — Ambulatory Visit (INDEPENDENT_AMBULATORY_CARE_PROVIDER_SITE_OTHER): Payer: 59

## 2016-05-06 ENCOUNTER — Encounter: Payer: Self-pay | Admitting: Women's Health

## 2016-05-06 ENCOUNTER — Ambulatory Visit (INDEPENDENT_AMBULATORY_CARE_PROVIDER_SITE_OTHER): Payer: 59 | Admitting: Women's Health

## 2016-05-06 DIAGNOSIS — O3680X0 Pregnancy with inconclusive fetal viability, not applicable or unspecified: Secondary | ICD-10-CM

## 2016-05-06 NOTE — Patient Instructions (Signed)
First Trimester of Pregnancy The first trimester of pregnancy is from week 1 until the end of week 13 (months 1 through 3). A week after a sperm fertilizes an egg, the egg will implant on the wall of the uterus. This embryo will begin to develop into a baby. Genes from you and your partner will form the baby. The female genes will determine whether the baby will be a boy or a girl. At 6-8 weeks, the eyes and face will be formed, and the heartbeat can be seen on ultrasound. At the end of 12 weeks, all the baby's organs will be formed. Now that you are pregnant, you will want to do everything you can to have a healthy baby. Two of the most important things are to get good prenatal care and to follow your health care provider's instructions. Prenatal care is all the medical care you receive before the baby's birth. This care will help prevent, find, and treat any problems during the pregnancy and childbirth. Body changes during your first trimester Your body goes through many changes during pregnancy. The changes vary from woman to woman.  You may gain or lose a couple of pounds at first.  You may feel sick to your stomach (nauseous) and you may throw up (vomit). If the vomiting is uncontrollable, call your health care provider.  You may tire easily.  You may develop headaches that can be relieved by medicines. All medicines should be approved by your health care provider.  You may urinate more often. Painful urination may mean you have a bladder infection.  You may develop heartburn as a result of your pregnancy.  You may develop constipation because certain hormones are causing the muscles that push stool through your intestines to slow down.  You may develop hemorrhoids or swollen veins (varicose veins).  Your breasts may begin to grow larger and become tender. Your nipples may stick out more, and the tissue that surrounds them (areola) may become darker.  Your gums may bleed and may be  sensitive to brushing and flossing.  Dark spots or blotches (chloasma, mask of pregnancy) may develop on your face. This will likely fade after the baby is born.  Your menstrual periods will stop.  You may have a loss of appetite.  You may develop cravings for certain kinds of food.  You may have changes in your emotions from day to day, such as being excited to be pregnant or being concerned that something may go wrong with the pregnancy and baby.  You may have more vivid and strange dreams.  You may have changes in your hair. These can include thickening of your hair, rapid growth, and changes in texture. Some women also have hair loss during or after pregnancy, or hair that feels dry or thin. Your hair will most likely return to normal after your baby is born.  What to expect at prenatal visits During a routine prenatal visit:  You will be weighed to make sure you and the baby are growing normally.  Your blood pressure will be taken.  Your abdomen will be measured to track your baby's growth.  The fetal heartbeat will be listened to between weeks 10 and 14 of your pregnancy.  Test results from any previous visits will be discussed.  Your health care provider may ask you:  How you are feeling.  If you are feeling the baby move.  If you have had any abnormal symptoms, such as leaking fluid, bleeding, severe headaches,   or abdominal cramping.  If you are using any tobacco products, including cigarettes, chewing tobacco, and electronic cigarettes.  If you have any questions.  Other tests that may be performed during your first trimester include:  Blood tests to find your blood type and to check for the presence of any previous infections. The tests will also be used to check for low iron levels (anemia) and protein on red blood cells (Rh antibodies). Depending on your risk factors, or if you previously had diabetes during pregnancy, you may have tests to check for high blood  sugar that affects pregnant women (gestational diabetes).  Urine tests to check for infections, diabetes, or protein in the urine.  An ultrasound to confirm the proper growth and development of the baby.  Fetal screens for spinal cord problems (spina bifida) and Down syndrome.  HIV (human immunodeficiency virus) testing. Routine prenatal testing includes screening for HIV, unless you choose not to have this test.  You may need other tests to make sure you and the baby are doing well.  Follow these instructions at home: Medicines  Follow your health care provider's instructions regarding medicine use. Specific medicines may be either safe or unsafe to take during pregnancy.  Take a prenatal vitamin that contains at least 600 micrograms (mcg) of folic acid.  If you develop constipation, try taking a stool softener if your health care provider approves. Eating and drinking  Eat a balanced diet that includes fresh fruits and vegetables, whole grains, good sources of protein such as meat, eggs, or tofu, and low-fat dairy. Your health care provider will help you determine the amount of weight gain that is right for you.  Avoid raw meat and uncooked cheese. These carry germs that can cause birth defects in the baby.  Eating four or five small meals rather than three large meals a day may help relieve nausea and vomiting. If you start to feel nauseous, eating a few soda crackers can be helpful. Drinking liquids between meals, instead of during meals, also seems to help ease nausea and vomiting.  Limit foods that are high in fat and processed sugars, such as fried and sweet foods.  To prevent constipation: ? Eat foods that are high in fiber, such as fresh fruits and vegetables, whole grains, and beans. ? Drink enough fluid to keep your urine clear or pale yellow. Activity  Exercise only as directed by your health care provider. Most women can continue their usual exercise routine during  pregnancy. Try to exercise for 30 minutes at least 5 days a week. Exercising will help you: ? Control your weight. ? Stay in shape. ? Be prepared for labor and delivery.  Experiencing pain or cramping in the lower abdomen or lower back is a good sign that you should stop exercising. Check with your health care provider before continuing with normal exercises.  Try to avoid standing for long periods of time. Move your legs often if you must stand in one place for a long time.  Avoid heavy lifting.  Wear low-heeled shoes and practice good posture.  You may continue to have sex unless your health care provider tells you not to. Relieving pain and discomfort  Wear a good support bra to relieve breast tenderness.  Take warm sitz baths to soothe any pain or discomfort caused by hemorrhoids. Use hemorrhoid cream if your health care provider approves.  Rest with your legs elevated if you have leg cramps or low back pain.  If you develop   varicose veins in your legs, wear support hose. Elevate your feet for 15 minutes, 3-4 times a day. Limit salt in your diet. Prenatal care  Schedule your prenatal visits by the twelfth week of pregnancy. They are usually scheduled monthly at first, then more often in the last 2 months before delivery.  Write down your questions. Take them to your prenatal visits.  Keep all your prenatal visits as told by your health care provider. This is important. Safety  Wear your seat belt at all times when driving.  Make a list of emergency phone numbers, including numbers for family, friends, the hospital, and police and fire departments. General instructions  Ask your health care provider for a referral to a local prenatal education class. Begin classes no later than the beginning of month 6 of your pregnancy.  Ask for help if you have counseling or nutritional needs during pregnancy. Your health care provider can offer advice or refer you to specialists for help  with various needs.  Do not use hot tubs, steam rooms, or saunas.  Do not douche or use tampons or scented sanitary pads.  Do not cross your legs for long periods of time.  Avoid cat litter boxes and soil used by cats. These carry germs that can cause birth defects in the baby and possibly loss of the fetus by miscarriage or stillbirth.  Avoid all smoking, herbs, alcohol, and medicines not prescribed by your health care provider. Chemicals in these products affect the formation and growth of the baby.  Do not use any products that contain nicotine or tobacco, such as cigarettes and e-cigarettes. If you need help quitting, ask your health care provider. You may receive counseling support and other resources to help you quit.  Schedule a dentist appointment. At home, brush your teeth with a soft toothbrush and be gentle when you floss. Contact a health care provider if:  You have dizziness.  You have mild pelvic cramps, pelvic pressure, or nagging pain in the abdominal area.  You have persistent nausea, vomiting, or diarrhea.  You have a bad smelling vaginal discharge.  You have pain when you urinate.  You notice increased swelling in your face, hands, legs, or ankles.  You are exposed to fifth disease or chickenpox.  You are exposed to German measles (rubella) and have never had it. Get help right away if:  You have a fever.  You are leaking fluid from your vagina.  You have spotting or bleeding from your vagina.  You have severe abdominal cramping or pain.  You have rapid weight gain or loss.  You vomit blood or material that looks like coffee grounds.  You develop a severe headache.  You have shortness of breath.  You have any kind of trauma, such as from a fall or a car accident. Summary  The first trimester of pregnancy is from week 1 until the end of week 13 (months 1 through 3).  Your body goes through many changes during pregnancy. The changes vary from  woman to woman.  You will have routine prenatal visits. During those visits, your health care provider will examine you, discuss any test results you may have, and talk with you about how you are feeling. This information is not intended to replace advice given to you by your health care provider. Make sure you discuss any questions you have with your health care provider. Document Released: 01/08/2001 Document Revised: 12/27/2015 Document Reviewed: 12/27/2015 Elsevier Interactive Patient Education  2017 [employer]   Inc[employer]  

## 2016-05-06 NOTE — Progress Notes (Signed)
Presents for viability/dating ultrasound. Was noted to have a positive UPT 04/16/2016 with probable LMP 03/09/2016. Having no bleeding, discharge, pain. Taking prenatal vitamin daily.  Exam: Appears well. Husband present. Ultrasound: T/V anteverted uterus with living IUP seen in fundus. Size equal dates by LMP date. CRL 8 weeks 6 days, fetal pole seen, fetal heart rate 173 BPM. Right ovary corpus luteal cyst 21 x 24 mm. Left ovary normal. Cervix long and closed. Negative cul-de-sac. Normal-shaped yolk sac.  First trimester pregnancy  Plan: Safe pregnancy behaviors reviewed. Continue prenatal vitamin daily. Aware we no longer deliver will seek prenatal care elsewhere. A copy of ultrasound report given. Congratulations given. Aware to avoid Adderall.

## 2016-05-31 LAB — OB RESULTS CONSOLE RUBELLA ANTIBODY, IGM: RUBELLA: IMMUNE

## 2016-05-31 LAB — OB RESULTS CONSOLE GC/CHLAMYDIA
Chlamydia: NEGATIVE
Gonorrhea: NEGATIVE

## 2016-05-31 LAB — OB RESULTS CONSOLE HIV ANTIBODY (ROUTINE TESTING): HIV: NONREACTIVE

## 2016-05-31 LAB — OB RESULTS CONSOLE ABO/RH: RH Type: POSITIVE

## 2016-05-31 LAB — OB RESULTS CONSOLE ANTIBODY SCREEN: ANTIBODY SCREEN: NEGATIVE

## 2016-05-31 LAB — OB RESULTS CONSOLE HEPATITIS B SURFACE ANTIGEN: Hepatitis B Surface Ag: NEGATIVE

## 2016-05-31 LAB — OB RESULTS CONSOLE RPR: RPR: NONREACTIVE

## 2016-06-12 ENCOUNTER — Encounter: Payer: Self-pay | Admitting: Gynecology

## 2016-11-11 LAB — OB RESULTS CONSOLE GBS: GBS: NEGATIVE

## 2016-12-10 ENCOUNTER — Encounter (HOSPITAL_COMMUNITY): Payer: Self-pay | Admitting: *Deleted

## 2016-12-10 ENCOUNTER — Telehealth (HOSPITAL_COMMUNITY): Payer: Self-pay | Admitting: *Deleted

## 2016-12-10 ENCOUNTER — Other Ambulatory Visit: Payer: Self-pay | Admitting: Obstetrics

## 2016-12-10 NOTE — Telephone Encounter (Signed)
Preadmission screen  

## 2016-12-12 ENCOUNTER — Encounter (HOSPITAL_COMMUNITY): Payer: Self-pay | Admitting: *Deleted

## 2016-12-12 ENCOUNTER — Telehealth (HOSPITAL_COMMUNITY): Payer: Self-pay | Admitting: *Deleted

## 2016-12-12 NOTE — Telephone Encounter (Signed)
Preadmission screen  

## 2016-12-17 ENCOUNTER — Encounter (HOSPITAL_COMMUNITY): Payer: Self-pay

## 2016-12-17 ENCOUNTER — Other Ambulatory Visit: Payer: Self-pay

## 2016-12-17 ENCOUNTER — Inpatient Hospital Stay (HOSPITAL_COMMUNITY)
Admission: RE | Admit: 2016-12-17 | Discharge: 2016-12-19 | DRG: 806 | Disposition: A | Discharge status: Home / Self Care | Payer: 59 | Source: Ambulatory Visit | Attending: Obstetrics | Admitting: Obstetrics

## 2016-12-17 ENCOUNTER — Inpatient Hospital Stay (HOSPITAL_COMMUNITY): Payer: 59 | Admitting: Anesthesiology

## 2016-12-17 DIAGNOSIS — Z3A40 40 weeks gestation of pregnancy: Secondary | ICD-10-CM | POA: Diagnosis not present

## 2016-12-17 DIAGNOSIS — Z880 Allergy status to penicillin: Secondary | ICD-10-CM | POA: Diagnosis not present

## 2016-12-17 DIAGNOSIS — O480 Post-term pregnancy: Secondary | ICD-10-CM | POA: Diagnosis present

## 2016-12-17 DIAGNOSIS — Z8759 Personal history of other complications of pregnancy, childbirth and the puerperium: Secondary | ICD-10-CM

## 2016-12-17 LAB — CBC
HEMATOCRIT: 38 % (ref 36.0–46.0)
Hemoglobin: 12.4 g/dL (ref 12.0–15.0)
MCH: 28.4 pg (ref 26.0–34.0)
MCHC: 32.6 g/dL (ref 30.0–36.0)
MCV: 87 fL (ref 78.0–100.0)
PLATELETS: 297 10*3/uL (ref 150–400)
RBC: 4.37 MIL/uL (ref 3.87–5.11)
RDW: 14.5 % (ref 11.5–15.5)
WBC: 11 10*3/uL — ABNORMAL HIGH (ref 4.0–10.5)

## 2016-12-17 LAB — TYPE AND SCREEN
ABO/RH(D): O POS
ANTIBODY SCREEN: NEGATIVE

## 2016-12-17 LAB — ABO/RH: ABO/RH(D): O POS

## 2016-12-17 MED ORDER — ACETAMINOPHEN 325 MG PO TABS: 650.0000 mg | ORAL_TABLET | ORAL | Status: DC | PRN

## 2016-12-17 MED ORDER — LACTATED RINGERS IV SOLN: 500.0000 mL | INTRAVENOUS | Status: DC | PRN

## 2016-12-17 MED ORDER — LIDOCAINE HCL (PF) 1 % IJ SOLN
30.0000 mL | INTRAMUSCULAR | Status: DC | PRN
  Filled 2016-12-17: qty 30

## 2016-12-17 MED ORDER — DIPHENHYDRAMINE HCL 50 MG/ML IJ SOLN: 12.5000 mg | INTRAMUSCULAR | Status: DC | PRN

## 2016-12-17 MED ORDER — ONDANSETRON HCL 4 MG/2ML IJ SOLN
4.0000 mg | Freq: Four times a day (QID) | INTRAMUSCULAR | Status: DC | PRN
  Administered 2016-12-17: 4 mg via INTRAVENOUS
  Filled 2016-12-17: qty 2

## 2016-12-17 MED ORDER — LIDOCAINE HCL (PF) 1 % IJ SOLN
INTRAMUSCULAR | Status: DC | PRN
  Administered 2016-12-17 (×2): 6 mL via EPIDURAL

## 2016-12-17 MED ORDER — WITCH HAZEL-GLYCERIN EX PADS: 1.0000 "application " | MEDICATED_PAD | CUTANEOUS | Status: DC | PRN

## 2016-12-17 MED ORDER — OXYTOCIN 40 UNITS IN LACTATED RINGERS INFUSION - SIMPLE MED
1.0000 m[IU]/min | INTRAVENOUS | Status: DC
  Administered 2016-12-17: 2 m[IU]/min via INTRAVENOUS
  Filled 2016-12-17: qty 1000

## 2016-12-17 MED ORDER — EPHEDRINE 5 MG/ML INJ
10.0000 mg | INTRAVENOUS | Status: DC | PRN
  Filled 2016-12-17: qty 2

## 2016-12-17 MED ORDER — IBUPROFEN 600 MG PO TABS
600.0000 mg | ORAL_TABLET | Freq: Four times a day (QID) | ORAL | Status: DC
  Administered 2016-12-17 – 2016-12-19 (×7): 600 mg via ORAL
  Filled 2016-12-17 (×7): qty 1

## 2016-12-17 MED ORDER — SIMETHICONE 80 MG PO CHEW: 80.0000 mg | CHEWABLE_TABLET | ORAL | Status: DC | PRN

## 2016-12-17 MED ORDER — LACTATED RINGERS IV SOLN
INTRAVENOUS | Status: DC
  Administered 2016-12-17: 125 mL/h via INTRAVENOUS
  Administered 2016-12-17: 14:00:00 via INTRAVENOUS

## 2016-12-17 MED ORDER — OXYTOCIN BOLUS FROM INFUSION
500.0000 mL | Freq: Once | INTRAVENOUS | Status: AC
  Administered 2016-12-17: 500 mL via INTRAVENOUS

## 2016-12-17 MED ORDER — PHENYLEPHRINE 40 MCG/ML (10ML) SYRINGE FOR IV PUSH (FOR BLOOD PRESSURE SUPPORT)
80.0000 ug | PREFILLED_SYRINGE | INTRAVENOUS | Status: DC | PRN
  Filled 2016-12-17: qty 5

## 2016-12-17 MED ORDER — OXYCODONE HCL 5 MG PO TABS: 5.0000 mg | ORAL_TABLET | ORAL | Status: DC | PRN

## 2016-12-17 MED ORDER — TETANUS-DIPHTH-ACELL PERTUSSIS 5-2.5-18.5 LF-MCG/0.5 IM SUSP: 0.5000 mL | Freq: Once | INTRAMUSCULAR | Status: DC

## 2016-12-17 MED ORDER — METHYLERGONOVINE MALEATE 0.2 MG/ML IJ SOLN
0.2000 mg | Freq: Once | INTRAMUSCULAR | Status: AC
  Administered 2016-12-17: 0.2 mg via INTRAMUSCULAR

## 2016-12-17 MED ORDER — PHENYLEPHRINE 40 MCG/ML (10ML) SYRINGE FOR IV PUSH (FOR BLOOD PRESSURE SUPPORT)
80.0000 ug | PREFILLED_SYRINGE | INTRAVENOUS | Status: DC | PRN
  Filled 2016-12-17: qty 5
  Filled 2016-12-17: qty 10

## 2016-12-17 MED ORDER — ONDANSETRON HCL 4 MG/2ML IJ SOLN: 4.0000 mg | INTRAMUSCULAR | Status: DC | PRN

## 2016-12-17 MED ORDER — ZOLPIDEM TARTRATE 5 MG PO TABS: 5.0000 mg | ORAL_TABLET | Freq: Every evening | ORAL | Status: DC | PRN

## 2016-12-17 MED ORDER — DIPHENHYDRAMINE HCL 25 MG PO CAPS: 25.0000 mg | ORAL_CAPSULE | Freq: Four times a day (QID) | ORAL | Status: DC | PRN

## 2016-12-17 MED ORDER — FENTANYL 2.5 MCG/ML BUPIVACAINE 1/10 % EPIDURAL INFUSION (WH - ANES)
14.0000 mL/h | INTRAMUSCULAR | Status: DC | PRN
  Administered 2016-12-17: 14 mL/h via EPIDURAL
  Filled 2016-12-17 (×2): qty 100

## 2016-12-17 MED ORDER — OXYTOCIN 40 UNITS IN LACTATED RINGERS INFUSION - SIMPLE MED
2.5000 [IU]/h | INTRAVENOUS | Status: DC
  Administered 2016-12-17: 2.5 [IU]/h via INTRAVENOUS

## 2016-12-17 MED ORDER — PRENATAL MULTIVITAMIN CH
1.0000 | ORAL_TABLET | Freq: Every day | ORAL | Status: DC
  Administered 2016-12-18 – 2016-12-19 (×2): 1 via ORAL
  Filled 2016-12-17 (×2): qty 1

## 2016-12-17 MED ORDER — BENZOCAINE-MENTHOL 20-0.5 % EX AERO
1.0000 "application " | INHALATION_SPRAY | CUTANEOUS | Status: DC | PRN
  Administered 2016-12-18: 1 via TOPICAL
  Filled 2016-12-17: qty 56

## 2016-12-17 MED ORDER — COCONUT OIL OIL
1.0000 "application " | TOPICAL_OIL | Status: DC | PRN
  Administered 2016-12-18: 1 via TOPICAL
  Filled 2016-12-17: qty 120

## 2016-12-17 MED ORDER — TERBUTALINE SULFATE 1 MG/ML IJ SOLN
0.2500 mg | Freq: Once | INTRAMUSCULAR | Status: DC | PRN
  Filled 2016-12-17: qty 1

## 2016-12-17 MED ORDER — SENNOSIDES-DOCUSATE SODIUM 8.6-50 MG PO TABS
2.0000 | ORAL_TABLET | ORAL | Status: DC
  Administered 2016-12-17 – 2016-12-19 (×2): 2 via ORAL
  Filled 2016-12-17 (×2): qty 2

## 2016-12-17 MED ORDER — ONDANSETRON HCL 4 MG PO TABS: 4.0000 mg | ORAL_TABLET | ORAL | Status: DC | PRN

## 2016-12-17 MED ORDER — LACTATED RINGERS IV SOLN: 500.0000 mL | Freq: Once | INTRAVENOUS | Status: DC

## 2016-12-17 MED ORDER — ACETAMINOPHEN 325 MG PO TABS
650.0000 mg | ORAL_TABLET | ORAL | Status: DC | PRN
Start: 2016-12-17 — End: 2016-12-19

## 2016-12-17 MED ORDER — OXYCODONE HCL 5 MG PO TABS: 10.0000 mg | ORAL_TABLET | ORAL | Status: DC | PRN

## 2016-12-17 MED ORDER — SOD CITRATE-CITRIC ACID 500-334 MG/5ML PO SOLN: 30.0000 mL | ORAL | Status: DC | PRN

## 2016-12-17 MED ORDER — DIBUCAINE 1 % RE OINT: 1.0000 "application " | TOPICAL_OINTMENT | RECTAL | Status: DC | PRN

## 2016-12-17 NOTE — Anesthesia Pain Management Evaluation Note (Signed)
  CRNA Pain Management Visit Note  Patient: Kelly Fitzgerald, 31 y.o., female  "Hello I am a member of the anesthesia team at [facility]. We have an anesthesia team available at all times to provide care throughout the hospital, including epidural management and anesthesia for C-section. I don't know your plan for the delivery whether it a natural birth, water birth, IV sedation, nitrous supplementation, doula or epidural, but we want to meet your pain goals."   1.Was your pain managed to your expectations on prior hospitalizations?   No prior hospitalizations  2.What is your expectation for pain management during this hospitalization?     Epidural  3.How can we help you reach that goal? Epidural infusing with good pain relief  Record the patient's initial score and the patient's pain goal.   Pain: 2  Pain Goal: 5 [facility] wants you to be able to say your pain was always managed very well.  [facility] HospitalMERRITT,Evanny Ellerbe 12/17/2016

## 2016-12-17 NOTE — Progress Notes (Signed)
S: Doing well, no complaints, pain well controlled with epidural  O: BP (!) 130/97   Pulse (!) 55   Temp 98 F (36.7 C) (Oral)   Resp 16   Ht 5\' 6"  (1.676 m)   Wt 74.8 kg (165 lb)   LMP 03/05/2016 (LMP Unknown)   BMI 26.63 kg/m    FHT:  FHR: 125s bpm, variability: moderate,  accelerations:  Present,  decelerations:  Present early decels present UC:   regular, every 2 minutes SVE:   Dilation: 7 Effacement (%): 100 Station: 0 Exam by:: Dr Ernestina PennaFogleman  10/100/ +1 to +2  A / P:  31 y.o.  Obstetric History   G1   P0   T0   P0   A0   L0    SAB0   TAB0   Ectopic0   Multiple0   Live Births0    at 5966w5d Augmentation of labor, progressing well  Fetal Wellbeing:  Category I Pain Control:  Epidural  Anticipated MOD:  NSVD  Kelly Fitzgerald 12/17/2016, 5:31 PM

## 2016-12-17 NOTE — H&P (Signed)
Kelly Fitzgerald is a 31 y.o. G1P0 at 5652w5d presenting for IOL though pt started contracting at 4:30am prior to scheduled IOL. Good fetal movement, No vaginal bleeding, not  leaking fluid.  [facility] 8 wks - Dated by LMP c/w 8 wk u/s - unplanned but desired preg - h/o LEEP, nl CL - PCN allergy   Prenatal Transfer Tool  Maternal Diabetes: No Genetic Screening: Normal Maternal Ultrasounds/Referrals: Normal Fetal Ultrasounds or other Referrals:  None Maternal Substance Abuse:  No Significant Maternal Medications:  None Significant Maternal Lab Results: None     OB History    Gravida Para Term Preterm AB Living   1             SAB TAB Ectopic Multiple Live Births                 Past Medical History:  Diagnosis Date  . Allergy   . Dysrhythmia    irregular heartbeats with shortness of breath wore monitor for one month in high school.  no diagnosis made at that time  . Migraines   . Pneumonia    walking pneumonia childhood  . Staph aureus infection    Past Surgical History:  Procedure Laterality Date  . ABSCESS DRAINAGE    . CO2 LASER APPLICATION N/A 09/30/2013   Procedure: CO2 LASER OF CERVIX;  Surgeon: Ok EdwardsJuan H Fernandez, MD;  Location: [facility];  Service: Gynecology;  Laterality: N/A;   Family History: family history includes Cancer in her father, maternal grandfather, maternal grandmother, and paternal grandfather; Hypertension in her mother; Ovarian cancer in her paternal grandmother; Rheum arthritis in her mother. Social History:  reports that  has never smoked. she has never used smokeless tobacco. She reports that she drinks about 3.6 oz of alcohol per week. She reports that she does not use drugs.  Review of Systems - Negative except contractions   Dilation: 7 Effacement (%): 100 Station: 0 Exam by:: Dr Ernestina PennaFogleman Blood pressure 112/71, pulse 75, temperature 98.4 F (36.9 C), temperature source Oral, resp. rate 16, height 5\' 6"  (1.676 m), weight  74.8 kg (165 lb), last menstrual period 03/05/2016.  Physical Exam:  Gen: well appearing, no distress  Back: no CVAT Abd: gravid, NT, no RUQ pain LE: no edema, equal bilaterally, non-tender Toco: q 3 min FH: baseline 130, accelerations present, no deceleratons, minimal variability at badeline but good scalp stim.   Prenatal labs: ABO, Rh: --/--/O POS (11/20 16100743) Antibody: NEG (11/20 0730) Rubella: Immune (05/04 0000) RPR: Nonreactive (05/04 0000)  HBsAg: Negative (05/04 0000)  HIV: Non-reactive (05/04 0000)  GBS: Negative (10/15 0000)  1 hr Glucola 101   Genetic screening nl quad Anatomy US normal   Assessment/Plan: 31 y.o. G1P0 at 6652w5d IOL, post dates but started laboring prior, now augmentation of labor GBS neg   Lendon ColonelKelly A Jermika Olden 12/17/2016, 2:12 PM

## 2016-12-17 NOTE — Anesthesia Procedure Notes (Signed)
Epidural Patient location during procedure: OB Start time: 12/17/2016 8:30 AM End time: 12/17/2016 8:34 AM  Staffing Anesthesiologist: Leilani AbleHatchett, Singleton Hickox, MD Performed: anesthesiologist   Preanesthetic Checklist Completed: patient identified, site marked, surgical consent, pre-op evaluation, timeout performed, IV checked, risks and benefits discussed and monitors and equipment checked  Epidural Patient position: sitting Prep: site prepped and draped and DuraPrep Patient monitoring: continuous pulse ox and blood pressure Approach: midline Location: L3-L4 Injection technique: LOR air  Needle:  Needle type: Tuohy  Needle gauge: 17 G Needle length: 9 cm and 9 Needle insertion depth: 5 cm cm Catheter type: closed end flexible Catheter size: 19 Gauge Catheter at skin depth: 10 cm Test dose: negative and Other  Assessment Sensory level: T9 Events: blood not aspirated, injection not painful, no injection resistance, negative IV test and no paresthesia  Additional Notes Reason for block:procedure for pain

## 2016-12-17 NOTE — Anesthesia Preprocedure Evaluation (Signed)
Anesthesia Evaluation  Patient identified by MRN, date of birth, ID band Patient awake    Reviewed: Allergy & Precautions, H&P , NPO status , Patient's Chart, lab work & pertinent test results  Airway Mallampati: I  TM Distance: >3 FB Neck ROM: full    Dental no notable dental hx. (+) Teeth Intact   Pulmonary neg pulmonary ROS,    Pulmonary exam normal breath sounds clear to auscultation       Cardiovascular Normal cardiovascular exam Rhythm:regular Rate:Normal     Neuro/Psych negative psych ROS   GI/Hepatic negative GI ROS, Neg liver ROS,   Endo/Other  negative endocrine ROS  Renal/GU negative Renal ROS     Musculoskeletal negative musculoskeletal ROS (+)   Abdominal Normal abdominal exam  (+)   Peds  Hematology negative hematology ROS (+)   Anesthesia Other Findings   Reproductive/Obstetrics (+) Pregnancy                             Anesthesia Physical Anesthesia Plan  ASA: II  Anesthesia Plan: Epidural   Post-op Pain Management:    Induction:   PONV Risk Score and Plan:   Airway Management Planned:   Additional Equipment:   Intra-op Plan:   Post-operative Plan:   Informed Consent: I have reviewed the patients History and Physical, chart, labs and discussed the procedure including the risks, benefits and alternatives for the proposed anesthesia with the patient or authorized representative who has indicated his/her understanding and acceptance.     Plan Discussed with:   Anesthesia Plan Comments:         Anesthesia Quick Evaluation

## 2016-12-18 LAB — RPR: RPR Ser Ql: NONREACTIVE

## 2016-12-18 NOTE — Lactation Note (Signed)
This note was copied from a baby's chart. Lactation Consultation Note  Patient Name: Kelly Fitzgerald ZOXWR'UToday's Date: 12/18/2016 Reason for consult: Initial assessment  Baby 18 hours old. Mom reports that baby has been latching and nursing well. Mom had questions about pumping/nursing and returning to work--all questions answered. [facility], aware of OP/BFSG and LC phone line assistance after D/C.  Maternal Data    Feeding Feeding Type: Breast Fed Length of feed: 30 min  LATCH Score                   Interventions    Lactation Tools Discussed/Used     Consult Status Consult Status: Follow-up Date: 12/19/16 Follow-up type: In-patient    Sherlyn HayJennifer D Kaitlyne Friedhoff 12/18/2016, 1:52 PM

## 2016-12-18 NOTE — Progress Notes (Signed)
Interval note:  Rounded on patient to discuss early discharge at 8pm.  The patient and husband have decided not to leave early because the pediatrician would like to monitor the baby until tomorrow.  I gave the option of being discharged tonight and rooming in, but pt. Declined at this time.  We will plan for discharge tomorrow.  Carlean JewsMeredith Jaquay Posthumus, CNM

## 2016-12-18 NOTE — Plan of Care (Signed)
Patient is ambulating well, voiding without issue, and has taken a shower. She is feeling well despite some slight soreness.

## 2016-12-18 NOTE — Progress Notes (Signed)
Post Partum Day 1 S/P SVD with episiotomy, shoulder dystocia and small PPH, which resolved quickly with addition of methergine.  Feeding: breast Subjective: No HA, SOB, CP, F/C, breast symptoms. Normal vaginal bleeding, no clots. Pain controlled.  ambulating without symptoms.  voiding without difficulty    Objective: BP 113/63 (BP Location: Right Arm)   Pulse 76   Temp 98.4 F (36.9 C) (Axillary)   Resp 16   Ht 5\' 6"  (1.676 m)   Wt 74.8 kg (165 lb)   LMP 03/05/2016 (LMP Unknown)   Breastfeeding? Unknown   BMI 26.63 kg/m  I&O reviewed.   Physical Exam:  General: alert, cooperative and no distress Lochia: appropriate Uterine Fundus: firm DVT Evaluation: No evidence of DVT seen on physical exam. Ext: No c/c/e Recent Labs    12/17/16 0743  HGB 12.4  HCT 38.0      Assessment/Plan: 31 y.o.  PPD #1 .  normal postpartum exam Continue current postpartum care Ambulate   LOS: 1 day   Lendon ColonelKelly A Lexxus Underhill 12/18/2016 8:32 AM

## 2016-12-18 NOTE — Anesthesia Postprocedure Evaluation (Signed)
Anesthesia Post Note  Patient: Kelly Fitzgerald  Procedure(s) Performed: AN AD HOC LABOR EPIDURAL     Anesthesia Post Evaluation  Last Vitals:  Vitals:   12/17/16 2245 12/18/16 0244  BP: 102/64 113/63  Pulse: (!) 56 76  Resp:  16  Temp: 36.8 C 36.9 C    Last Pain:  Vitals:   12/18/16 0244  TempSrc: Axillary  PainSc: 0-No pain   Pain Goal:  5               Shahzaib Azevedo R

## 2016-12-18 NOTE — Lactation Note (Signed)
This note was copied from a baby's chart. Lactation Consultation Note  Patient Name: Kelly Georgette Shellfton Idris ZHYQM'VToday's Date: 12/18/2016 Reason for consult: Initial assessment  Baby 20 hours old. Mom called to ask how much of her areola should be in the baby's mouth when baby latched. Offered to assist mom with a latch, but mom states that she just finished nursing the baby. Discussed how to achieve a deep latch and enc mom to call for assistance as needed with next feeding. Discussed with mom how to remove baby from breast safely in order not to damage nipple. Enc mom to take baby off breast and re-latch if baby not latched deeply--so that mom's nipples will not become sore.   Maternal Data    Feeding    LATCH Score                   Interventions    Lactation Tools Discussed/Used     Consult Status Consult Status: Follow-up Date: 12/19/16 Follow-up type: In-patient    Kelly Fitzgerald 12/18/2016, 3:51 PM

## 2016-12-19 ENCOUNTER — Encounter (HOSPITAL_COMMUNITY): Payer: Self-pay

## 2016-12-19 MED ORDER — IBUPROFEN 600 MG PO TABS: 600.0000 mg | ORAL_TABLET | Freq: Four times a day (QID) | ORAL | 0 refills | Status: DC

## 2016-12-19 MED ORDER — BENZOCAINE-MENTHOL 20-0.5 % EX AERO: 1.0000 "application " | INHALATION_SPRAY | CUTANEOUS | Status: DC | PRN

## 2016-12-19 MED ORDER — COCONUT OIL OIL: 1.0000 "application " | TOPICAL_OIL | 0 refills | Status: DC | PRN

## 2016-12-19 NOTE — Discharge Summary (Signed)
Obstetric Discharge Summary Reason for Admission: onset of labor and induction of labor Prenatal Procedures: ultrasound Intrapartum Procedures: spontaneous vaginal delivery and epidural Postpartum Procedures: none Complications-Operative and Postpartum: Left mediolateral episiotomy, mild shoulder distocia Hemoglobin  Date Value Ref Range Status  12/17/2016 12.4 12.0 - 15.0 g/dL Final   HCT  Date Value Ref Range Status  12/17/2016 38.0 36.0 - 46.0 % Final    Physical Exam:  General: alert, cooperative and no distress Lochia: appropriate Uterine Fundus: firm Incision: healing well DVT Evaluation: No cords or calf tenderness.  Discharge Diagnoses: Term Pregnancy-delivered  Discharge Information: Date: 12/19/2016 Activity: pelvic rest Diet: routine Medications:  Allergies as of 12/19/2016      Reactions   [name] Other (See Comments)   Was told by Allergist   Amoxicillin Hives   Has patient had a PCN reaction causing immediate rash, facial/tongue/throat swelling, SOB or lightheadedness with hypotension: no Has patient had a PCN reaction causing severe rash involving mucus membranes or skin necrosis:no Has patient had a PCN reaction that required hospitalization: no Has patient had a PCN reaction occurring within the last 10 years: yes If all of the above answers are "NO", then may proceed with Cephalosporin use.   Erythromycin Nausea And Vomiting   Penicillins Hives   Has patient had a PCN reaction causing immediate rash, facial/tongue/throat swelling, SOB or lightheadedness with hypotension: no Has patient had a PCN reaction causing severe rash involving mucus membranes or skin necrosis:no Has patient had a PCN reaction that required hospitalization: no Has patient had a PCN reaction occurring within the last 10 years: yes If all of the above answers are "NO", then may proceed with Cephalosporin use.      Medication List    TAKE these medications   acetaminophen  500 MG tablet Commonly known as:  TYLENOL Take 1,000 mg every 6 (six) hours as needed by mouth for moderate pain.   benzocaine-Menthol 20-0.5 % Aero Commonly known as:  DERMOPLAST Apply 1 application topically as needed for irritation (perineal discomfort).   calcium carbonate 500 MG chewable tablet Commonly known as:  TUMS - dosed in mg elemental calcium Chew 2 tablets by mouth 3 (three) times daily as needed for indigestion or heartburn.   coconut oil Oil Apply 1 application topically as needed.   ibuprofen 600 MG tablet Commonly known as:  ADVIL,MOTRIN Take 1 tablet (600 mg total) by mouth every 6 (six) hours.   prenatal multivitamin Tabs tablet Take 2 tablets by mouth daily at 12 noon.            Discharge Care Instructions  (From admission, onward)        Start     Ordered   12/19/16 0000  Discharge wound care:    Comments:  Sitz baths 2 times /day with warm water x 1 week   12/19/16 1117     Condition: stable Instructions: refer to practice specific booklet Discharge to: home Follow-up Information    Noland FordyceFogleman, Kelly, MD. Go in 6 week(s).   Specialty:  Obstetrics and Gynecology Contact information: [address] [place] [address] [phone]           Newborn Data: Live born female  Birth Weight: 8 lb 3.4 oz (3725 g) APGAR: 8, 9  Newborn Delivery   Time head delivered:  12/17/2016 19:40:00 Birth date/time:  12/17/2016 19:41:00 Delivery type:  Vaginal, Spontaneous     Home with mother.  Kelly Fitzgerald 12/19/2016, 11:18 AM

## 2016-12-19 NOTE — Lactation Note (Signed)
This note was copied from a baby's chart. Lactation Consultation Note  Patient Name: Kelly Fitzgerald Today's Date: 12/19/2016  Mom reports baby cluster feeding but more content this AM.  Discussed milk coming to volume and engorgement treatment.  Mom denies questions/concerns.  Lactation outpatient services and support information reviewed and encouraged prn.   Maternal Data    Feeding Feeding Type: Breast Fed  LATCH Score Latch: Grasps breast easily, tongue down, lips flanged, rhythmical sucking.  Audible Swallowing: Spontaneous and intermittent  Type of Nipple: Everted at rest and after stimulation  Comfort (Breast/Nipple): Soft / non-tender  Hold (Positioning): No assistance needed to correctly position infant at breast.  LATCH Score: 10  Interventions    Lactation Tools Discussed/Used     Consult Status      Huston FoleyMOULDEN, Valene Villa S 12/19/2016, 9:40 AM

## 2018-07-05 ENCOUNTER — Encounter (HOSPITAL_COMMUNITY): Payer: Self-pay

## 2018-07-05 ENCOUNTER — Emergency Department (HOSPITAL_COMMUNITY)
Admission: EM | Admit: 2018-07-05 | Discharge: 2018-07-05 | Disposition: A | Discharge status: Home / Self Care | Payer: Commercial Managed Care - PPO | Attending: Emergency Medicine | Admitting: Emergency Medicine

## 2018-07-05 ENCOUNTER — Other Ambulatory Visit: Payer: Self-pay

## 2018-07-05 ENCOUNTER — Emergency Department (HOSPITAL_COMMUNITY): Payer: Commercial Managed Care - PPO

## 2018-07-05 DIAGNOSIS — R1012 Left upper quadrant pain: Secondary | ICD-10-CM | POA: Diagnosis present

## 2018-07-05 DIAGNOSIS — K529 Noninfective gastroenteritis and colitis, unspecified: Secondary | ICD-10-CM | POA: Insufficient documentation

## 2018-07-05 DIAGNOSIS — Z79899 Other long term (current) drug therapy: Secondary | ICD-10-CM | POA: Insufficient documentation

## 2018-07-05 LAB — URINALYSIS, ROUTINE W REFLEX MICROSCOPIC
Bacteria, UA: NONE SEEN
Bilirubin Urine: NEGATIVE
Glucose, UA: NEGATIVE mg/dL
Ketones, ur: 80 mg/dL — AB
Leukocytes,Ua: NEGATIVE
Nitrite: NEGATIVE
Protein, ur: NEGATIVE mg/dL
Specific Gravity, Urine: 1.004 — ABNORMAL LOW (ref 1.005–1.030)
pH: 6 (ref 5.0–8.0)

## 2018-07-05 LAB — CBC
HCT: 44.1 % (ref 36.0–46.0)
Hemoglobin: 14.3 g/dL (ref 12.0–15.0)
MCH: 27.9 pg (ref 26.0–34.0)
MCHC: 32.4 g/dL (ref 30.0–36.0)
MCV: 86 fL (ref 80.0–100.0)
Platelets: 266 10*3/uL (ref 150–400)
RBC: 5.13 MIL/uL — ABNORMAL HIGH (ref 3.87–5.11)
RDW: 13.3 % (ref 11.5–15.5)
WBC: 6.7 10*3/uL (ref 4.0–10.5)
nRBC: 0 % (ref 0.0–0.2)

## 2018-07-05 LAB — COMPREHENSIVE METABOLIC PANEL
ALT: 26 U/L (ref 0–44)
AST: 33 U/L (ref 15–41)
Albumin: 4.3 g/dL (ref 3.5–5.0)
Alkaline Phosphatase: 40 U/L (ref 38–126)
Anion gap: 11 (ref 5–15)
BUN: 9 mg/dL (ref 6–20)
CO2: 22 mmol/L (ref 22–32)
Calcium: 8.2 mg/dL — ABNORMAL LOW (ref 8.9–10.3)
Chloride: 103 mmol/L (ref 98–111)
Creatinine, Ser: 0.61 mg/dL (ref 0.44–1.00)
GFR calc Af Amer: >60 mL/min (ref >60)
GFR calc non Af Amer: >60 mL/min (ref >60)
Glucose, Bld: 107 mg/dL — ABNORMAL HIGH (ref 70–99)
Potassium: 3.3 mmol/L — ABNORMAL LOW (ref 3.5–5.1)
Sodium: 136 mmol/L (ref 135–145)
Total Bilirubin: 0.7 mg/dL (ref 0.3–1.2)
Total Protein: 7.6 g/dL (ref 6.5–8.1)

## 2018-07-05 LAB — I-STAT BETA HCG BLOOD, ED (MC, WL, AP ONLY): I-stat hCG, quantitative: <5 m[IU]/mL (ref <5)

## 2018-07-05 LAB — LIPASE, BLOOD: Lipase: 43 U/L (ref 11–51)

## 2018-07-05 MED ORDER — DICYCLOMINE HCL 20 MG PO TABS: 20.0000 mg | ORAL_TABLET | Freq: Two times a day (BID) | ORAL | 0 refills | Status: DC

## 2018-07-05 MED ORDER — LOPERAMIDE HCL 2 MG PO CAPS: 2.0000 mg | ORAL_CAPSULE | Freq: Four times a day (QID) | ORAL | 0 refills | Status: AC | PRN

## 2018-07-05 MED ORDER — ONDANSETRON HCL 4 MG PO TABS: 4.0000 mg | ORAL_TABLET | Freq: Four times a day (QID) | ORAL | 0 refills | Status: DC

## 2018-07-05 MED ORDER — MORPHINE SULFATE (PF) 4 MG/ML IV SOLN
4.0000 mg | Freq: Once | INTRAVENOUS | Status: AC
  Administered 2018-07-05: 4 mg via INTRAVENOUS
  Filled 2018-07-05: qty 1

## 2018-07-05 MED ORDER — SODIUM CHLORIDE 0.9 % IV BOLUS
1000.0000 mL | Freq: Once | INTRAVENOUS | Status: AC
  Administered 2018-07-05: 1000 mL via INTRAVENOUS

## 2018-07-05 MED ORDER — IOHEXOL 300 MG/ML  SOLN
100.0000 mL | Freq: Once | INTRAMUSCULAR | Status: AC | PRN
  Administered 2018-07-05: 100 mL via INTRAVENOUS

## 2018-07-05 MED ORDER — SODIUM CHLORIDE (PF) 0.9 % IJ SOLN
INTRAMUSCULAR | Status: AC
  Filled 2018-07-05: qty 50

## 2018-07-05 MED ORDER — ONDANSETRON HCL 4 MG/2ML IJ SOLN
4.0000 mg | Freq: Once | INTRAMUSCULAR | Status: AC
  Administered 2018-07-05: 4 mg via INTRAVENOUS
  Filled 2018-07-05: qty 2

## 2018-07-05 MED ORDER — POTASSIUM CHLORIDE 10 MEQ/100ML IV SOLN
10.0000 meq | Freq: Once | INTRAVENOUS | Status: AC
  Administered 2018-07-05: 10 meq via INTRAVENOUS
  Filled 2018-07-05: qty 100

## 2018-07-05 MED ORDER — SODIUM CHLORIDE 0.9% FLUSH
3.0000 mL | Freq: Once | INTRAVENOUS | Status: AC
  Administered 2018-07-05: 3 mL via INTRAVENOUS

## 2018-07-05 NOTE — ED Provider Notes (Signed)
[facility] Note   CSN: [id number] Arrival date & time: 07/05/18  1123    History   Chief Complaint Chief Complaint  Patient presents with  . Abdominal Pain    HPI Kelly Fitzgerald is a 33 y.o. female who presents to the ED today complaining of LUQ abdominal pain x 2 days. She also endorses nausea, nonbloody nonbiliuos emesis, and diarrhea. Pt has been taking Tylenol for pain as well as peptobismal without relief. No recent suspicious food intake. No recent abx use. No excessive NSAID use. Patient does endorse drinking more than normal during pandemic; will have atleast 1 serving of beer or liquor per day; more on the weekends. No hx of pancreatitis. No PSHx to abdomen. Denies fever, chills, urinary complaints, vaginal discharge, pelvic pain, vaginal bleeding. LNMP 1 week ago.        Past Medical History:  Diagnosis Date  . Allergy   . Dysrhythmia    irregular heartbeats with shortness of breath wore monitor for one month in high school.  no diagnosis made at that time  . Migraines   . Pneumonia    walking pneumonia childhood  . Staph aureus infection     Patient Active Problem List   Diagnosis Date Noted  . Postpartum care following vaginal delivery (11/20) 12/18/2016  . Obstetrical laceration: left mediolateral episiotomy  12/18/2016  . Shoulder dystocia, delivered 12/18/2016  . Postpartum hemorrhage 12/18/2016  . Personal history of previous postdates pregnancy 12/17/2016  . Dysplasia of cervix, high grade CIN 2 09/07/2013  . ASCUS with positive high risk HPV 08/05/2013    Past Surgical History:  Procedure Laterality Date  . ABSCESS DRAINAGE    . CO2 LASER APPLICATION N/A 09/30/2013   Procedure: CO2 LASER OF CERVIX;  Surgeon: Ok EdwardsJuan H Fernandez, MD;  Location: [facility];  Service: Gynecology;  Laterality: N/A;     OB History    Gravida  1   Para  1   Term  1   Preterm      AB      Living  1     SAB      TAB      Ectopic      Multiple  0   Live Births  1            Home Medications    Prior to Admission medications   Medication Sig Start Date End Date Taking? Authorizing Provider  acetaminophen (TYLENOL) 500 MG tablet Take 1,000 mg every 6 (six) hours as needed by mouth for moderate pain.   Yes [provider]  bismuth subsalicylate (PEPTO-BISMOL) 262 MG/15ML suspension Take 30 mLs by mouth every 6 (six) hours as needed for indigestion or diarrhea or loose stools.   Yes [provider]  Prenatal Vit-Fe Fumarate-FA (PRENATAL MULTIVITAMIN) TABS tablet Take 2 tablets by mouth daily at 12 noon.   Yes [provider]  benzocaine-Menthol (DERMOPLAST) 20-0.5 % AERO Apply 1 application topically as needed for irritation (perineal discomfort). Patient not taking: Reported on 07/05/2018 12/19/16   Neta MendsPaul, Daniela C, CNM  coconut oil OIL Apply 1 application topically as needed. Patient not taking: Reported on 07/05/2018 12/19/16   Neta MendsPaul, Daniela C, CNM  dicyclomine (BENTYL) 20 MG tablet Take 1 tablet (20 mg total) by mouth 2 (two) times daily. 07/05/18   Hyman HopesVenter, Sol Englert, PA-C  ibuprofen (ADVIL,MOTRIN) 600 MG tablet Take 1 tablet (600 mg total) by mouth every 6 (six) hours. Patient not taking:  Reported on 07/05/2018 12/19/16   Juliene Pina, CNM  loperamide (IMODIUM) 2 MG capsule Take 1 capsule (2 mg total) by mouth 4 (four) times daily as needed for diarrhea or loose stools. 07/05/18   Timothey Dahlstrom, PA-C  ondansetron (ZOFRAN) 4 MG tablet Take 1 tablet (4 mg total) by mouth every 6 (six) hours. 07/05/18   Eustaquio Maize, PA-C    Family History Family History  Problem Relation Age of Onset  . Hypertension Mother   . Rheum arthritis Mother   . Cancer Father        prostate  . Ovarian cancer Paternal Grandmother   . Cancer Maternal Grandmother   . Cancer Maternal Grandfather   . Cancer Paternal Grandfather     Social History Social History   Tobacco Use  . Smoking status:  Never Smoker  . Smokeless tobacco: Never Used  Substance Use Topics  . Alcohol use: Yes    Alcohol/week: 6.0 standard drinks    Types: 6 Standard drinks or equivalent per week    Comment: SOCIAL  . Drug use: No     Allergies   Amoxicillin; Erythromycin; and Penicillins   Review of Systems Review of Systems  Constitutional: Negative for chills and fever.  HENT: Negative for congestion.   Eyes: Negative for redness.  Respiratory: Negative for cough and shortness of breath.   Cardiovascular: Negative for chest pain.  Gastrointestinal: Positive for abdominal pain, diarrhea, nausea and vomiting. Negative for blood in stool and constipation.  Genitourinary: Negative for dysuria, flank pain, frequency, hematuria, pelvic pain, vaginal bleeding, vaginal discharge and vaginal pain.  Musculoskeletal: Negative for back pain.  Skin: Negative for rash.  Neurological: Negative for headaches.     Physical Exam Updated Vital Signs BP 113/75   Pulse 75   Temp 98.7 F (37.1 C) (Oral)   Resp 18   Ht 5\' 6"  (1.676 m)   Wt 63.7 kg   LMP 06/29/2018   SpO2 94%   BMI 22.68 kg/m   Physical Exam Vitals signs and nursing note reviewed.  Constitutional:      Appearance: She is not ill-appearing.  HENT:     Head: Normocephalic and atraumatic.  Eyes:     Conjunctiva/sclera: Conjunctivae normal.  Neck:     Musculoskeletal: Neck supple.  Cardiovascular:     Rate and Rhythm: Normal rate and regular rhythm.     Heart sounds: Normal heart sounds.  Pulmonary:     Effort: Pulmonary effort is normal.     Breath sounds: Normal breath sounds. No wheezing, rhonchi or rales.  Abdominal:     General: Abdomen is flat.     Palpations: Abdomen is soft.     Tenderness: There is abdominal tenderness in the epigastric area and left upper quadrant. There is no guarding or rebound. Negative signs include Murphy's sign and McBurney's sign.  Skin:    General: Skin is warm and dry.  Neurological:      Mental Status: She is alert.      ED Treatments / Results  Labs (all labs ordered are listed, but only abnormal results are displayed) Labs Reviewed  COMPREHENSIVE METABOLIC PANEL - Abnormal; Notable for the following components:      Result Value   Potassium 3.3 (*)    Glucose, Bld 107 (*)    Calcium 8.2 (*)    All other components within normal limits  CBC - Abnormal; Notable for the following components:   RBC 5.13 (*)    All  other components within normal limits  URINALYSIS, ROUTINE W REFLEX MICROSCOPIC - Abnormal; Notable for the following components:   Color, Urine STRAW (*)    Specific Gravity, Urine 1.004 (*)    Hgb urine dipstick SMALL (*)    Ketones, ur 80 (*)    All other components within normal limits  LIPASE, BLOOD  I-STAT BETA HCG BLOOD, ED ([facility], [facility], [facility] ONLY)    EKG None  Radiology Ct Abdomen Pelvis W Contrast  Result Date: 07/05/2018 CLINICAL DATA:  Abdominal pain with nausea and vomiting EXAM: CT ABDOMEN AND PELVIS WITH CONTRAST TECHNIQUE: Multidetector CT imaging of the abdomen and pelvis was performed using the standard protocol following bolus administration of intravenous contrast. CONTRAST:  100mL OMNIPAQUE IOHEXOL 300 MG/ML  SOLN COMPARISON:  None. FINDINGS: Lower chest: There is mild bibasilar pleural thickening. There is no lung base edema or consolidation. Hepatobiliary: No focal liver lesions are demonstrable. The gallbladder wall is not appreciably thickened. There is no biliary duct dilatation. Pancreas: There is no pancreatic mass or inflammatory focus. Spleen: No splenic lesions are evident. Adrenals/Urinary Tract: Adrenals bilaterally appear unremarkable. Kidneys bilaterally show no evident mass or hydronephrosis on either side. There is no evident renal or ureteral calculus on either side. Urinary bladder is midline with wall thickness within normal limits. Stomach/Bowel: Most small bowel loops are fluid filled. There is no appreciable bowel wall or  mesenteric thickening. There is no demonstrable bowel obstruction. There is no free air or portal venous air. Terminal ileum appears normal although terminal ileum is primarily fluid-filled like most of the rest of the small bowel. Vascular/Lymphatic: There is no abdominal aortic aneurysm. No vascular lesions are evident. There is no adenopathy in the abdomen or pelvis. Reproductive: Uterus is anteverted. There is no pelvic mass beyond a follicle in the right ovary measuring 1.6 x 1.3 cm. There is mild to moderate free fluid in the cul-de-sac, however. Other: Appendix appears normal. There is no abscess in the abdomen or pelvis. There is no ascites beyond the fluid in the cul-de-sac. Musculoskeletal: No blastic or lytic bone lesions are evident. There is no intramuscular or abdominal wall lesion. IMPRESSION: 1. Mild to moderate free fluid in the cul-de-sac. Question recent ovarian cyst rupture. 2. Most small bowel loops are fluid-filled. Suspect a degree of enteritis or early ileus. No bowel obstruction. No wall thickening involving bowel. 3.  No abscess in the abdomen or pelvis.  Appendix appears normal. 4. No renal or ureteral calculus. No hydronephrosis. Urinary bladder wall thickness is within normal limits. Electronically Signed   By: Bretta BangWilliam  Woodruff III M.D.   On: 07/05/2018 14:50    Procedures Procedures (including critical care time)  Medications Ordered in ED Medications  sodium chloride (PF) 0.9 % injection (has no administration in time range)  potassium chloride 10 mEq in 100 mL IVPB (10 mEq Intravenous New Bag/Given 07/05/18 1425)  sodium chloride flush (NS) 0.9 % injection 3 mL (3 mLs Intravenous Given 07/05/18 1314)  sodium chloride 0.9 % bolus 1,000 mL (0 mLs Intravenous Stopped 07/05/18 1425)  ondansetron (ZOFRAN) injection 4 mg (4 mg Intravenous Given 07/05/18 1315)  morphine 4 MG/ML injection 4 mg (4 mg Intravenous Given 07/05/18 1315)  iohexol (OMNIPAQUE) 300 MG/ML solution 100 mL (100 mLs  Intravenous Contrast Given 07/05/18 1410)     Initial Impression / Assessment and Plan / ED Course  I have reviewed the triage vital signs and the nursing notes.  Pertinent labs & imaging results that were available  during my care of the patient were reviewed by me and considered in my medical decision making (see chart for details).    Pt is an otherwise healthy 33 year old female who presents with LUQ abdominal pain, nausea, vomiting, diarrhea x 2 days. No suspicious food intake. No recent abx use. No excessive NSAID use. Pt does have tenderness to epigastrium and LUQ on exam; no peritoneal signs to suggest acute abdomen. Pt does endorse drinking more than normal amounts of EtOH during this pandemic; suspicious for pancreatitis today vs gastroenteritis. No lower abdominal pain; do not suspect appendicitis. Will get baseline labs and reevaluate. IVF fluids, pain medicine, and zofran given for symptomatic relief as well.   Labwork reassuring today; no leukocytosis to suggest infectious process; mildly hypokalemic at 3.3; will replete with [facility] today; no other electrolyte derangements. Lipase negative, U/A clear. Upon reevaluation patient feeling improved with fluids and pain medicine. She is still mildly tender in the LUQ; discussed option of CT A/P versus sending home; patient would like to know what is causing pain. Will proceed with CT today.   CT negative; does note free fluid in the cul-de-sac of the pelvis; patient denies any lower abdominal pain; no vaginal complaints as well. Feel this is an incidental finding today; will have patient follow up with OBGYN regarding this. Will discharge home with zofran, imodium, and bentyl. Patient to follow up with PCP regarding ED visit. Strict return precautions discussed with patient. Patient is in agreement with plan and stable for discharge home.        Final Clinical Impressions(s) / ED Diagnoses   Final diagnoses:  Gastroenteritis    ED  Discharge Orders         Ordered    ondansetron (ZOFRAN) 4 MG tablet  Every 6 hours     07/05/18 1515    loperamide (IMODIUM) 2 MG capsule  4 times daily PRN     07/05/18 1515    dicyclomine (BENTYL) 20 MG tablet  2 times daily     07/05/18 1515           Tanda RockersVenter, Ronda Rajkumar, PA-C 07/05/18 1631    Sabas SousBero, Michael M, MD 07/08/18 [phone]

## 2018-07-05 NOTE — ED Triage Notes (Signed)
Pt states that she has had abd pain since Friday. She was unable to keep anything down that day, and only a little today. Pt states today, she has been able to eat an apple and some water. Pt states pain is mostly on the left side of her abd.  In the last 24 hours:  Diarrhea x 6 Emesis x 2-3  Pt also endorses bloating.

## 2018-07-05 NOTE — Discharge Instructions (Signed)
You were seen in the ED today for abdominal pain, nausea, vomiting, and diarrhea. Your labwork and CT scan were reassuring today; you are likely experiencing symptoms from a stomach bug; please take the medication prescribed for your symptoms. Please follow up with your PCP regarding your ED visit today.   Your CT scan did show a possible ruptured ovarian cyst; I do not think this is related to your pain. Please follow up with your OBGYN regarding this.   Return to the ED for any worsening symptoms including worsening pain, vomiting blood, fevers.

## 2018-10-02 LAB — OB RESULTS CONSOLE GC/CHLAMYDIA
Chlamydia: NEGATIVE
Gonorrhea: NEGATIVE

## 2018-10-02 LAB — OB RESULTS CONSOLE RUBELLA ANTIBODY, IGM: Rubella: IMMUNE

## 2018-10-02 LAB — OB RESULTS CONSOLE ANTIBODY SCREEN: Antibody Screen: NEGATIVE

## 2018-10-02 LAB — OB RESULTS CONSOLE ABO/RH: RH Type: POSITIVE

## 2018-10-02 LAB — OB RESULTS CONSOLE RPR: RPR: NONREACTIVE

## 2018-10-02 LAB — OB RESULTS CONSOLE HIV ANTIBODY (ROUTINE TESTING): HIV: NONREACTIVE

## 2018-10-02 LAB — OB RESULTS CONSOLE HEPATITIS B SURFACE ANTIGEN: Hepatitis B Surface Ag: NEGATIVE

## 2019-01-29 NOTE — L&D Delivery Note (Signed)
Delivery Note At 8:35 PM a viable female was delivered via Vaginal, Spontaneous (Presentation: Left Occiput Anterior).  APGAR:per nursing notes , ; weight  pending.   Placenta status: Spontaneous, Intact.  Cord: 3 vessels with the following complications: None.  Cord pH: nA  Terminal fetal bradycardia and decision for R mediolateral episiotomy due to tight band. (of note pt needed episiotomy in last preg as well). Room prepped for possible shoulder dystocia  Anesthesia: Epidural Episiotomy: Medio-lateral, right Lacerations: 2nd degree Suture Repair: 2.0 vicryl rapide Est. Blood Loss (mL):  400  Mom to postpartum.  Baby to Couplet care / Skin to Skin.  Lendon Colonel 06/03/2019, 9:02 PM

## 2019-05-04 LAB — OB RESULTS CONSOLE GBS: GBS: NEGATIVE

## 2019-05-26 ENCOUNTER — Telehealth (HOSPITAL_COMMUNITY): Payer: Self-pay | Admitting: *Deleted

## 2019-05-26 ENCOUNTER — Encounter (HOSPITAL_COMMUNITY): Payer: Self-pay | Admitting: *Deleted

## 2019-05-26 NOTE — Telephone Encounter (Signed)
Preadmission screen  

## 2019-05-27 ENCOUNTER — Encounter (HOSPITAL_COMMUNITY): Payer: Self-pay | Admitting: *Deleted

## 2019-05-27 ENCOUNTER — Telehealth (HOSPITAL_COMMUNITY): Payer: Self-pay | Admitting: *Deleted

## 2019-05-27 NOTE — Telephone Encounter (Signed)
Preadmission screen  

## 2019-06-01 ENCOUNTER — Other Ambulatory Visit (HOSPITAL_COMMUNITY)
Admission: RE | Admit: 2019-06-01 | Discharge: 2019-06-01 | Disposition: A | Discharge status: Home / Self Care | Payer: Commercial Managed Care - PPO | Source: Ambulatory Visit | Attending: Obstetrics | Admitting: Obstetrics

## 2019-06-01 ENCOUNTER — Other Ambulatory Visit: Payer: Self-pay | Admitting: Obstetrics

## 2019-06-01 DIAGNOSIS — Z01812 Encounter for preprocedural laboratory examination: Secondary | ICD-10-CM | POA: Insufficient documentation

## 2019-06-01 DIAGNOSIS — Z20822 Contact with and (suspected) exposure to covid-19: Secondary | ICD-10-CM | POA: Insufficient documentation

## 2019-06-01 LAB — SARS CORONAVIRUS 2 (TAT 6-24 HRS): SARS Coronavirus 2: NEGATIVE

## 2019-06-03 ENCOUNTER — Inpatient Hospital Stay (HOSPITAL_COMMUNITY)
Admission: AD | Admit: 2019-06-03 | Discharge: 2019-06-05 | DRG: 807 | Disposition: A | Discharge status: Home / Self Care | Payer: Commercial Managed Care - PPO | Attending: Obstetrics | Admitting: Obstetrics

## 2019-06-03 ENCOUNTER — Other Ambulatory Visit: Payer: Self-pay

## 2019-06-03 ENCOUNTER — Encounter (HOSPITAL_COMMUNITY): Payer: Self-pay | Admitting: Obstetrics

## 2019-06-03 ENCOUNTER — Inpatient Hospital Stay (HOSPITAL_COMMUNITY): Payer: Commercial Managed Care - PPO

## 2019-06-03 ENCOUNTER — Inpatient Hospital Stay (HOSPITAL_COMMUNITY): Payer: Commercial Managed Care - PPO | Admitting: Anesthesiology

## 2019-06-03 DIAGNOSIS — Z3490 Encounter for supervision of normal pregnancy, unspecified, unspecified trimester: Secondary | ICD-10-CM

## 2019-06-03 DIAGNOSIS — O26893 Other specified pregnancy related conditions, third trimester: Secondary | ICD-10-CM | POA: Diagnosis present

## 2019-06-03 DIAGNOSIS — Z3A39 39 weeks gestation of pregnancy: Secondary | ICD-10-CM | POA: Diagnosis not present

## 2019-06-03 LAB — CBC
HCT: 37.1 % (ref 36.0–46.0)
Hemoglobin: 11.8 g/dL — ABNORMAL LOW (ref 12.0–15.0)
MCH: 28.4 pg (ref 26.0–34.0)
MCHC: 31.8 g/dL (ref 30.0–36.0)
MCV: 89.4 fL (ref 80.0–100.0)
Platelets: 252 10*3/uL (ref 150–400)
RBC: 4.15 MIL/uL (ref 3.87–5.11)
RDW: 13.5 % (ref 11.5–15.5)
WBC: 8.1 10*3/uL (ref 4.0–10.5)
nRBC: 0 % (ref 0.0–0.2)

## 2019-06-03 LAB — TYPE AND SCREEN
ABO/RH(D): O POS
Antibody Screen: NEGATIVE

## 2019-06-03 LAB — ABO/RH: ABO/RH(D): O POS

## 2019-06-03 LAB — RPR: RPR Ser Ql: NONREACTIVE

## 2019-06-03 MED ORDER — DIPHENHYDRAMINE HCL 50 MG/ML IJ SOLN: 12.5000 mg | INTRAMUSCULAR | Status: DC | PRN

## 2019-06-03 MED ORDER — OXYCODONE HCL 5 MG PO TABS: 10.0000 mg | ORAL_TABLET | ORAL | Status: DC | PRN

## 2019-06-03 MED ORDER — BENZOCAINE-MENTHOL 20-0.5 % EX AERO
1.0000 "application " | INHALATION_SPRAY | CUTANEOUS | Status: DC | PRN
  Administered 2019-06-04 – 2019-06-05 (×2): 1 via TOPICAL
  Filled 2019-06-03 (×2): qty 56

## 2019-06-03 MED ORDER — COCONUT OIL OIL: 1.0000 "application " | TOPICAL_OIL | Status: DC | PRN

## 2019-06-03 MED ORDER — TERBUTALINE SULFATE 1 MG/ML IJ SOLN: 0.2500 mg | Freq: Once | INTRAMUSCULAR | Status: DC | PRN

## 2019-06-03 MED ORDER — TETANUS-DIPHTH-ACELL PERTUSSIS 5-2.5-18.5 LF-MCG/0.5 IM SUSP: 0.5000 mL | Freq: Once | INTRAMUSCULAR | Status: DC

## 2019-06-03 MED ORDER — SOD CITRATE-CITRIC ACID 500-334 MG/5ML PO SOLN: 30.0000 mL | ORAL | Status: DC | PRN

## 2019-06-03 MED ORDER — ZOLPIDEM TARTRATE 5 MG PO TABS: 5.0000 mg | ORAL_TABLET | Freq: Every evening | ORAL | Status: DC | PRN

## 2019-06-03 MED ORDER — SIMETHICONE 80 MG PO CHEW: 80.0000 mg | CHEWABLE_TABLET | ORAL | Status: DC | PRN

## 2019-06-03 MED ORDER — SENNOSIDES-DOCUSATE SODIUM 8.6-50 MG PO TABS
2.0000 | ORAL_TABLET | ORAL | Status: DC
  Administered 2019-06-04 – 2019-06-05 (×2): 2 via ORAL
  Filled 2019-06-03 (×2): qty 2

## 2019-06-03 MED ORDER — FENTANYL-BUPIVACAINE-NACL 0.5-0.125-0.9 MG/250ML-% EP SOLN
12.0000 mL/h | EPIDURAL | Status: DC | PRN
  Filled 2019-06-03: qty 250

## 2019-06-03 MED ORDER — DIBUCAINE (PERIANAL) 1 % EX OINT
1.0000 "application " | TOPICAL_OINTMENT | CUTANEOUS | Status: DC | PRN
  Administered 2019-06-05: 1 via RECTAL
  Filled 2019-06-03: qty 28

## 2019-06-03 MED ORDER — WITCH HAZEL-GLYCERIN EX PADS: 1.0000 "application " | MEDICATED_PAD | CUTANEOUS | Status: DC | PRN

## 2019-06-03 MED ORDER — LACTATED RINGERS IV SOLN: 500.0000 mL | INTRAVENOUS | Status: DC | PRN

## 2019-06-03 MED ORDER — EPHEDRINE 5 MG/ML INJ: 10.0000 mg | INTRAVENOUS | Status: DC | PRN

## 2019-06-03 MED ORDER — SODIUM CHLORIDE (PF) 0.9 % IJ SOLN
INTRAMUSCULAR | Status: DC | PRN
  Administered 2019-06-03: 12 mL/h via EPIDURAL

## 2019-06-03 MED ORDER — OXYCODONE-ACETAMINOPHEN 5-325 MG PO TABS: 1.0000 | ORAL_TABLET | ORAL | Status: DC | PRN

## 2019-06-03 MED ORDER — LIDOCAINE HCL (PF) 1 % IJ SOLN: 30.0000 mL | INTRAMUSCULAR | Status: DC | PRN

## 2019-06-03 MED ORDER — IBUPROFEN 600 MG PO TABS
600.0000 mg | ORAL_TABLET | Freq: Four times a day (QID) | ORAL | Status: DC
  Administered 2019-06-04 – 2019-06-05 (×7): 600 mg via ORAL
  Filled 2019-06-03 (×7): qty 1

## 2019-06-03 MED ORDER — LACTATED RINGERS IV SOLN: INTRAVENOUS | Status: DC

## 2019-06-03 MED ORDER — PHENYLEPHRINE 40 MCG/ML (10ML) SYRINGE FOR IV PUSH (FOR BLOOD PRESSURE SUPPORT): 80.0000 ug | PREFILLED_SYRINGE | INTRAVENOUS | Status: DC | PRN

## 2019-06-03 MED ORDER — OXYCODONE-ACETAMINOPHEN 5-325 MG PO TABS: 2.0000 | ORAL_TABLET | ORAL | Status: DC | PRN

## 2019-06-03 MED ORDER — PRENATAL MULTIVITAMIN CH
1.0000 | ORAL_TABLET | Freq: Every day | ORAL | Status: DC
  Administered 2019-06-04 – 2019-06-05 (×2): 1 via ORAL
  Filled 2019-06-03 (×2): qty 1

## 2019-06-03 MED ORDER — ACETAMINOPHEN 325 MG PO TABS
650.0000 mg | ORAL_TABLET | ORAL | Status: DC | PRN
  Administered 2019-06-03 (×3): 650 mg via ORAL
  Filled 2019-06-03 (×3): qty 2

## 2019-06-03 MED ORDER — OXYCODONE HCL 5 MG PO TABS: 5.0000 mg | ORAL_TABLET | ORAL | Status: DC | PRN

## 2019-06-03 MED ORDER — OXYTOCIN 40 UNITS IN NORMAL SALINE INFUSION - SIMPLE MED: 2.5000 [IU]/h | INTRAVENOUS | Status: DC

## 2019-06-03 MED ORDER — ONDANSETRON HCL 4 MG/2ML IJ SOLN: 4.0000 mg | INTRAMUSCULAR | Status: DC | PRN

## 2019-06-03 MED ORDER — ONDANSETRON HCL 4 MG/2ML IJ SOLN
4.0000 mg | Freq: Four times a day (QID) | INTRAMUSCULAR | Status: DC | PRN
  Administered 2019-06-03: 4 mg via INTRAVENOUS
  Filled 2019-06-03: qty 2

## 2019-06-03 MED ORDER — ONDANSETRON HCL 4 MG PO TABS: 4.0000 mg | ORAL_TABLET | ORAL | Status: DC | PRN

## 2019-06-03 MED ORDER — ACETAMINOPHEN 325 MG PO TABS
650.0000 mg | ORAL_TABLET | ORAL | Status: DC | PRN
  Administered 2019-06-04: 650 mg via ORAL
  Filled 2019-06-03: qty 2

## 2019-06-03 MED ORDER — OXYTOCIN BOLUS FROM INFUSION
500.0000 mL | Freq: Once | INTRAVENOUS | Status: AC
  Administered 2019-06-03: 500 mL via INTRAVENOUS

## 2019-06-03 MED ORDER — OXYTOCIN 40 UNITS IN NORMAL SALINE INFUSION - SIMPLE MED
1.0000 m[IU]/min | INTRAVENOUS | Status: DC
  Administered 2019-06-03: 10:00:00 2 m[IU]/min via INTRAVENOUS
  Filled 2019-06-03: qty 1000

## 2019-06-03 MED ORDER — LIDOCAINE HCL (PF) 1 % IJ SOLN
INTRAMUSCULAR | Status: DC | PRN
  Administered 2019-06-03: 10 mL via EPIDURAL
  Administered 2019-06-03: 2 mL via EPIDURAL

## 2019-06-03 MED ORDER — DIPHENHYDRAMINE HCL 25 MG PO CAPS: 25.0000 mg | ORAL_CAPSULE | Freq: Four times a day (QID) | ORAL | Status: DC | PRN

## 2019-06-03 MED ORDER — LACTATED RINGERS IV SOLN: 500.0000 mL | Freq: Once | INTRAVENOUS | Status: DC

## 2019-06-03 MED ORDER — PHENYLEPHRINE 40 MCG/ML (10ML) SYRINGE FOR IV PUSH (FOR BLOOD PRESSURE SUPPORT)
80.0000 ug | PREFILLED_SYRINGE | INTRAVENOUS | Status: DC | PRN
  Filled 2019-06-03: qty 10

## 2019-06-03 NOTE — H&P (Signed)
Kelly Fitzgerald is a 34 y.o. G2P1001 at [redacted]w[redacted]d presenting for IOL. Pt notes rare contractions. Good fetal movement, No vaginal bleeding, not leaking fluid.  [facility] 7 wks - dated by 7 wk u/s, unsure LMP - h/o shoulder dystocia, baby 9#/ post-dates, 10 sec dystocia, declines option for PCS - h/o CKC, nl CL at 16 wk - 35 wks u/s AGA   Prenatal Transfer Tool  Maternal Diabetes: No Genetic Screening: Normal Maternal Ultrasounds/Referrals: Normal Fetal Ultrasounds or other Referrals:  None Maternal Substance Abuse:  No Significant Maternal Medications:  None Significant Maternal Lab Results: Group B Strep negative     OB History    Gravida  2   Para  1   Term  1   Preterm      AB      Living  1     SAB      TAB      Ectopic      Multiple  0   Live Births  1          Past Medical History:  Diagnosis Date  . Allergy   . Dysrhythmia    irregular heartbeats with shortness of breath wore monitor for one month in high school.  no diagnosis made at that time  . Migraines   . Pneumonia    walking pneumonia childhood  . Staph aureus infection   . Vaginal Pap smear, abnormal    Past Surgical History:  Procedure Laterality Date  . ABSCESS DRAINAGE    . CO2 LASER APPLICATION N/A 09/30/2013   Procedure: CO2 LASER OF CERVIX;  Surgeon: Ok Edwards, MD;  Location: [facility];  Service: Gynecology;  Laterality: N/A;   Family History: family history includes Cancer in her father, maternal grandfather, maternal grandmother, and paternal grandfather; Hypertension in her mother; Ovarian cancer in her paternal grandmother; Rheum arthritis in her mother. Social History:  reports that she has never smoked. She has never used smokeless tobacco. She reports current alcohol use of about 6.0 standard drinks of alcohol per week. She reports that she does not use drugs.  Review of Systems - Negative except discomfort of preg     There were no vitals filed for  this visit.   Prenatal labs: ABO, Rh: O/Positive/-- (09/04 0000) Antibody: Negative (09/04 0000) Rubella: Immune (09/04 0000) RPR: Nonreactive (09/04 0000)  HBsAg: Negative (09/04 0000)  HIV: Non-reactive (09/04 0000)  GBS: Negative/-- (04/06 0000)  1 hr Glucola 143, nl 3 hr GTT  Genetic screening normal NT, nl AFP Anatomy US normal  CBC    Component Value Date/Time   WBC 6.7 07/05/2018 1302   RBC 5.13 (H) 07/05/2018 1302   HGB 14.3 07/05/2018 1302   HCT 44.1 07/05/2018 1302   PLT 266 07/05/2018 1302   MCV 86.0 07/05/2018 1302   MCH 27.9 07/05/2018 1302   MCHC 32.4 07/05/2018 1302   RDW 13.3 07/05/2018 1302   LYMPHSABS 2,482 10/25/2015 1007   MONOABS 511 10/25/2015 1007   EOSABS 438 10/25/2015 1007   BASOSABS 0 10/25/2015 1007     Assessment/Plan: 33 y.o. G2P1001 at [redacted]w[redacted]d IOL, elective at term H/o shoulder dystocia   Tresa Endo A Joevanni Roddey 06/03/2019, 8:21 AM

## 2019-06-03 NOTE — Progress Notes (Signed)
S: Doing well, no complaints, pain well controlled with epidural  O: BP (!) 99/49   Pulse 70   Temp 98.5 F (36.9 C) (Axillary)   Resp 16   Ht 5\' 6"  (1.676 m)   Wt 79.3 kg   BMI 28.21 kg/m    FHT:  FHR: 125's bpm, variability: moderate,  accelerations:  Present,  decelerations:  Absent UC:   regular, every 3-6 minutes SVE:   Dilation: 5 Effacement (%): 50 Station: -2 Exam by:: Dr. [email or web address]   A / P:  34 y.o.  OB History  Gravida Para Term Preterm AB Living  2 1 1  0 0 1  SAB TAB Ectopic Multiple Live Births  0 0 0 0 1   at [redacted]w[redacted]d Elective induction of labor at term.  Status post artificial rupture membranes.  Continue to increase Pitocin expect fetal descent over the next few hours  Fetal Wellbeing:  Category I Pain Control:  Epidural  Anticipated MOD:  NSVD  06/03/2019, 4:06 PM

## 2019-06-03 NOTE — Anesthesia Procedure Notes (Signed)
Epidural Patient location during procedure: OB Start time: 06/03/2019 2:06 PM End time: 06/03/2019 2:17 PM  Staffing Anesthesiologist: Lannie Fields, DO Performed: anesthesiologist   Preanesthetic Checklist Completed: patient identified, IV checked, risks and benefits discussed, monitors and equipment checked, pre-op evaluation and timeout performed  Epidural Patient position: sitting Prep: DuraPrep and site prepped and draped Patient monitoring: continuous pulse ox, blood pressure, heart rate and cardiac monitor Approach: midline Location: L3-L4 Injection technique: LOR air  Needle:  Needle type: Tuohy  Needle gauge: 17 G Needle length: 9 cm Needle insertion depth: 5 cm Catheter type: closed end flexible Catheter size: 19 Gauge Catheter at skin depth: 10 cm Test dose: negative  Assessment Sensory level: T8 Events: blood not aspirated, injection not painful, no injection resistance, no paresthesia and negative IV test  Additional Notes Patient identified. Risks/Benefits/Options discussed with patient including but not limited to bleeding, infection, nerve damage, paralysis, failed block, incomplete pain control, headache, blood pressure changes, nausea, vomiting, reactions to medication both or allergic, itching and postpartum back pain. Confirmed with bedside nurse the patient's most recent platelet count. Confirmed with patient that they are not currently taking any anticoagulation, have any bleeding history or any family history of bleeding disorders. Patient expressed understanding and wished to proceed. All questions were answered. Sterile technique was used throughout the entire procedure. Please see nursing notes for vital signs. Test dose was given through epidural catheter and negative prior to continuing to dose epidural or start infusion. Warning signs of high block given to the patient including shortness of breath, tingling/numbness in hands, complete motor block,  or any concerning symptoms with instructions to call for help. Patient was given instructions on fall risk and not to get out of bed. All questions and concerns addressed with instructions to call with any issues or inadequate analgesia.  Reason for block:procedure for pain

## 2019-06-03 NOTE — Anesthesia Preprocedure Evaluation (Addendum)
Anesthesia Evaluation  Patient identified by MRN, date of birth, ID band Patient awake    Reviewed: Allergy & Precautions, Patient's Chart, lab work & pertinent test results  Airway Mallampati: II  TM Distance: >3 FB Neck ROM: Full    Dental no notable dental hx. (+) Teeth Intact, Dental Advisory Given   Pulmonary neg pulmonary ROS,    Pulmonary exam normal breath sounds clear to auscultation       Cardiovascular Normal cardiovascular exam+ dysrhythmias  Rhythm:Regular Rate:Normal  Palpitations/PACs diagnosed as a teenager, no issues currently    Neuro/Psych  Headaches, negative psych ROS   GI/Hepatic negative GI ROS, Neg liver ROS,   Endo/Other  negative endocrine ROS  Renal/GU negative Renal ROS  negative genitourinary   Musculoskeletal negative musculoskeletal ROS (+)   Abdominal Normal abdominal exam  (+)   Peds negative pediatric ROS (+)  Hematology negative hematology ROS (+)   Anesthesia Other Findings   Reproductive/Obstetrics (+) Pregnancy                             Anesthesia Physical Anesthesia Plan  ASA: II and emergent  Anesthesia Plan: Epidural   Post-op Pain Management:    Induction:   PONV Risk Score and Plan: 2  Airway Management Planned: Natural Airway  Additional Equipment: None  Intra-op Plan:   Post-operative Plan:   Informed Consent: I have reviewed the patients History and Physical, chart, labs and discussed the procedure including the risks, benefits and alternatives for the proposed anesthesia with the patient or authorized representative who has indicated his/her understanding and acceptance.       Plan Discussed with:   Anesthesia Plan Comments:         Anesthesia Quick Evaluation

## 2019-06-04 NOTE — Progress Notes (Signed)
Post Partum Day 1 S/P induced vaginal  Feeding: breast Subjective: No HA, SOB, CP, F/C, breast symptoms. Normal vaginal bleeding, no clots. Pain controlled.  ambulating without symptoms.  voiding without difficulty no significant pain to episiotomy site  Objective: BP 112/71 (BP Location: Right Arm)   Pulse 62   Temp 97.9 F (36.6 C) (Oral)   Resp 17   Ht 5\' 6"  (1.676 m)   Wt 79.3 kg   SpO2 99%   Breastfeeding Unknown   BMI 28.21 kg/m  I&O reviewed.   Physical Exam:  General: alert and cooperative Lochia: appropriate Uterine Fundus: firm GU: Well-healing episiotomy site DVT Evaluation: No evidence of DVT seen on physical exam. Ext: No c/c/e Recent Labs    06/03/19 0906  HGB 11.8*  HCT 37.1      Assessment/Plan: 34 y.o.  PPD #1 .  normal postpartum exam Continue current postpartum care Ambulate Discharge home tomorrow   LOS: 1 day   31 06/04/2019 11:22 AM

## 2019-06-04 NOTE — Anesthesia Postprocedure Evaluation (Signed)
Anesthesia Post Note  Patient: Kelly Fitzgerald  Procedure(s) Performed: AN AD HOC LABOR EPIDURAL     Patient location during evaluation: Mother Baby Anesthesia Type: Epidural Level of consciousness: awake and alert and oriented Pain management: satisfactory to patient Vital Signs Assessment: post-procedure vital signs reviewed and stable Respiratory status: respiratory function stable Cardiovascular status: stable Postop Assessment: no headache, no backache, epidural receding, patient able to bend at knees, no signs of nausea or vomiting, adequate PO intake, no apparent nausea or vomiting and able to ambulate Anesthetic complications: no    Last Vitals:  Vitals:   06/04/19 0018 06/04/19 0420  BP: 113/65 (!) 90/53  Pulse: 69 (!) 48  Resp: 18 18  Temp: 36.7 C 36.8 C    Last Pain:  Vitals:   06/04/19 0625  TempSrc:   PainSc: 4    Pain Goal:                   [name]

## 2019-06-04 NOTE — Lactation Note (Signed)
This note was copied from a baby's chart. Lactation Consultation Note Experienced BF mom stated she didn't need to see Lactation. Mom states she is having no difficulty. BF her 1st child. Gave mom Lactation brochure, encouraged to call if needs assistance.  Patient Name: Kelly Fitzgerald Date: 06/04/2019     Maternal Data    Feeding Feeding Type: Breast Fed  LATCH Score                   Interventions    Lactation Tools Discussed/Used     Consult Status      Charyl Dancer 06/04/2019, 3:42 AM

## 2019-06-05 MED ORDER — IBUPROFEN 600 MG PO TABS: 600.0000 mg | ORAL_TABLET | Freq: Four times a day (QID) | ORAL | 0 refills | Status: AC

## 2019-06-05 NOTE — Discharge Summary (Signed)
Obstetric Discharge Summary Reason for Admission: induction of labor Prenatal Procedures: none Intrapartum Procedures: spontaneous vaginal delivery Postpartum Procedures: none Complications-Operative and Postpartum: none Hemoglobin  Date Value Ref Range Status  06/03/2019 11.8 (L) 12.0 - 15.0 g/dL Final   HCT  Date Value Ref Range Status  06/03/2019 37.1 36.0 - 46.0 % Final    Physical Exam:  General: alert, cooperative and no distress Lochia: appropriate Uterine Fundus: firm Incision: n/a DVT Evaluation: No evidence of DVT seen on physical exam.  Discharge Diagnoses: Term Pregnancy-delivered  Discharge Information: Date: 06/05/2019 Activity: pelvic rest Diet: routine Medications: ibuprofen Condition: stable Instructions: refer to practice specific booklet Discharge to: home Follow-up Information    Noland Fordyce, MD Follow up.   Specialty: Obstetrics and Gynecology Contact information: [address] [place] [address] [phone]           Newborn Data: Live born female  Birth Weight: 8 lb 4.3 oz (3751 g) APGAR: 9, 9  Newborn Delivery   Birth date/time: 06/03/2019 20:35:00 Delivery type: Vaginal, Spontaneous      Home with mother.  Vick Frees 06/05/2019, 11:52 AM

## 2019-06-05 NOTE — Progress Notes (Signed)
No c/o; voiding w/o difficulty, tol po; pain controlled with ibuprofen Breastfeeding, doing well  Patient Vitals for the past 24 hrs:  BP Temp Temp src Pulse Resp SpO2  06/05/19 0618 115/78 98.1 F (36.7 C) Oral 76 -- 99 %  06/05/19 0147 124/80 98 F (36.7 C) Oral 64 18 100 %  06/04/19 1422 116/79 97.9 F (36.6 C) Oral 69 18 --  06/04/19 1121 112/71 97.9 F (36.6 C) Oral 62 17 --   A&ox3 rrr ctab Abd: soft, nt, nd; fundus firm and below umb LE: no edema, nt bilat  CBC Latest Ref Rng & Units 06/03/2019 07/05/2018 12/17/2016  WBC 4.0 - 10.5 K/uL 8.1 6.7 11.0(H)  Hemoglobin 12.0 - 15.0 g/dL 11.8(L) 14.3 12.4  Hematocrit 36.0 - 46.0 % 37.1 44.1 38.0  Platelets 150 - 400 K/uL 252 266 297   A/P: ppd2 s/p svd 1. Doing well, d/c home  2. Rh pos 3. RI 4. Breastfeeding 5. Acute very mild anemia, plan iron rich foods

## 2020-09-27 IMAGING — CT CT ABDOMEN AND PELVIS WITH CONTRAST
2 of 4 series · 16 of 46 positions shown, 18 images · IV contrast (ISOVUE)
Comparison: None.

CLINICAL DATA: Abdominal pain with nausea and vomiting

EXAM:
CT ABDOMEN AND PELVIS WITH CONTRAST
TECHNIQUE: Multidetector CT imaging of the abdomen and pelvis was performed
using the standard protocol following bolus administration of
intravenous contrast.
CONTRAST:  100mL OMNIPAQUE IOHEXOL 300 MG/ML  SOLN

[Series 2: axial st · axial · 0.74mm/px · z∈[-495,-80]mm · 13 of 95 slices shown, 15 images]
[im 6/95  soft-tissue]
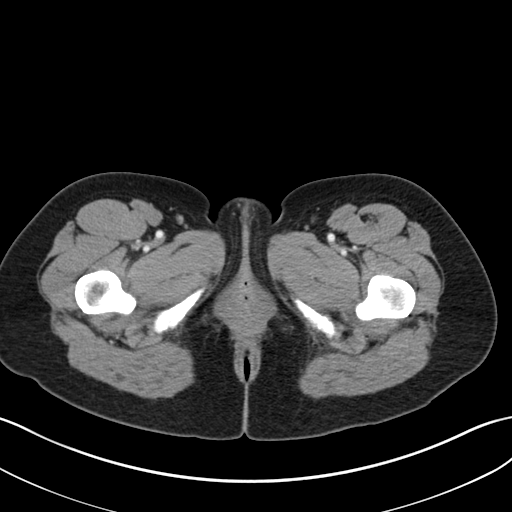
[im 6/95  bone]
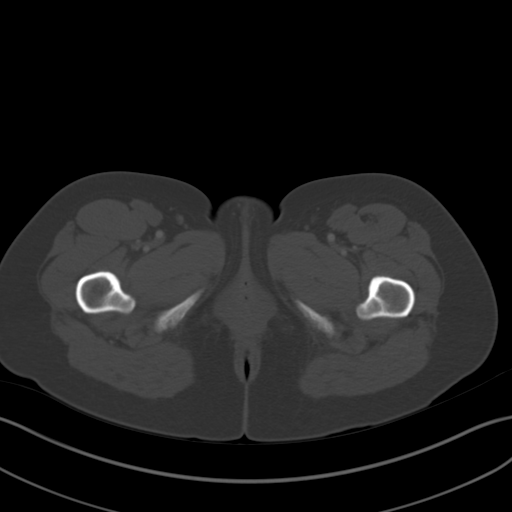
[im 11/95  soft-tissue]
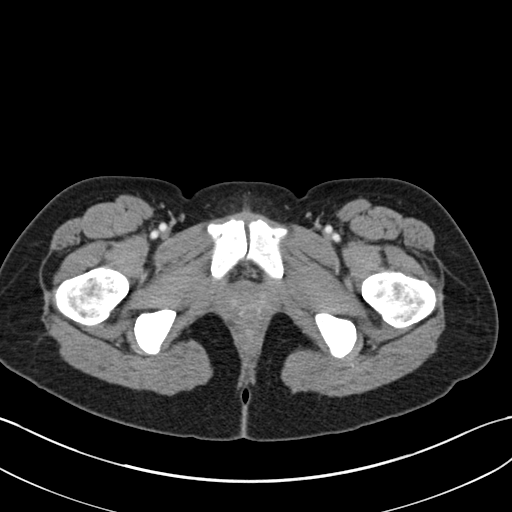
[im 21/95  soft-tissue]
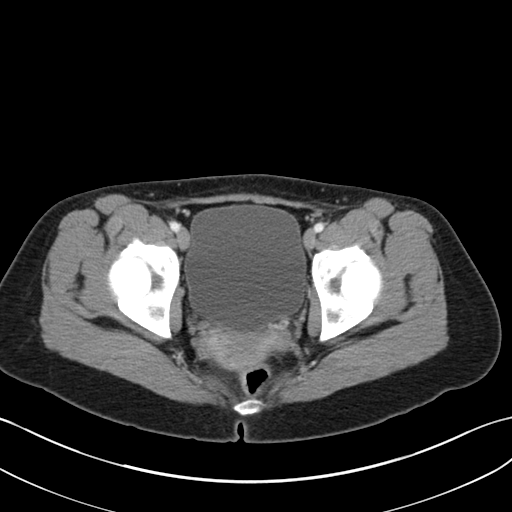
[im 27/95  soft-tissue]
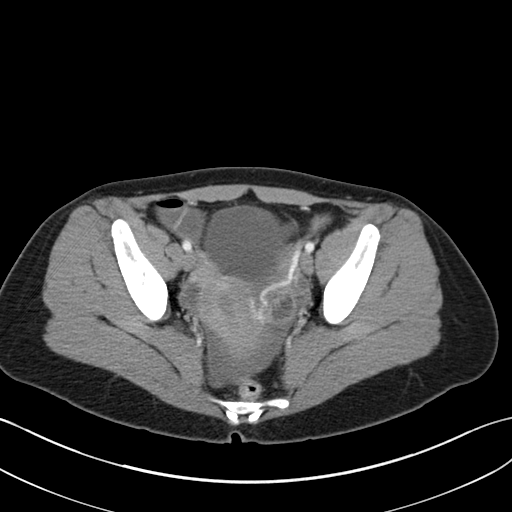
[im 32/95  soft-tissue]
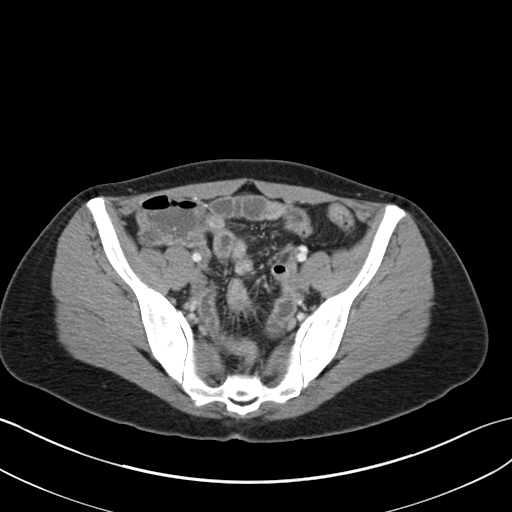
[im 42/95  soft-tissue]
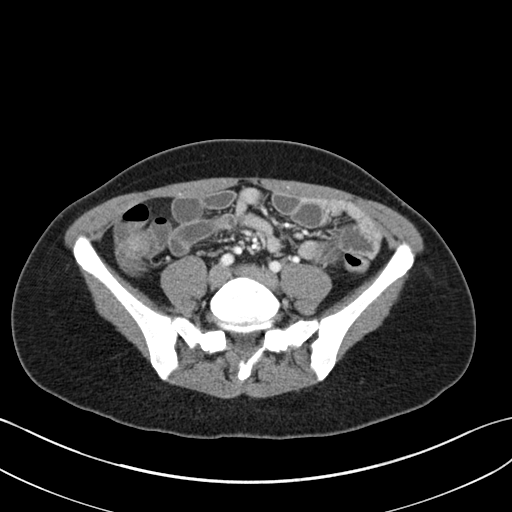
[im 48/95  soft-tissue]
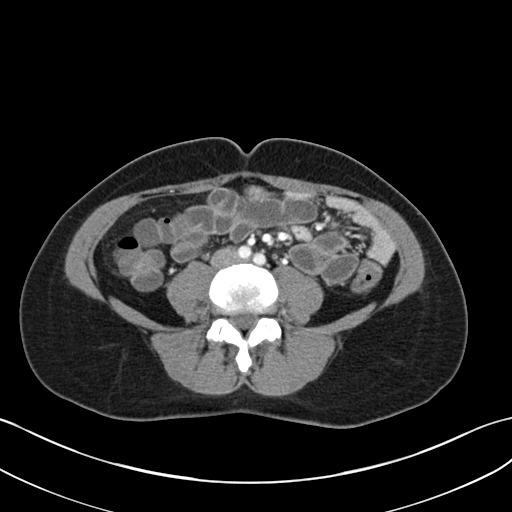
[im 53/95  soft-tissue]
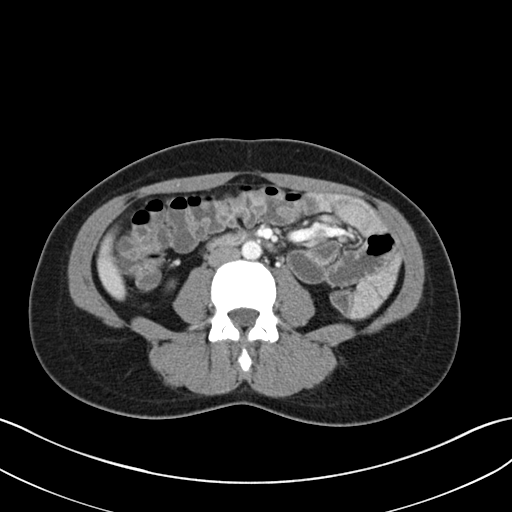
[im 63/95  soft-tissue]
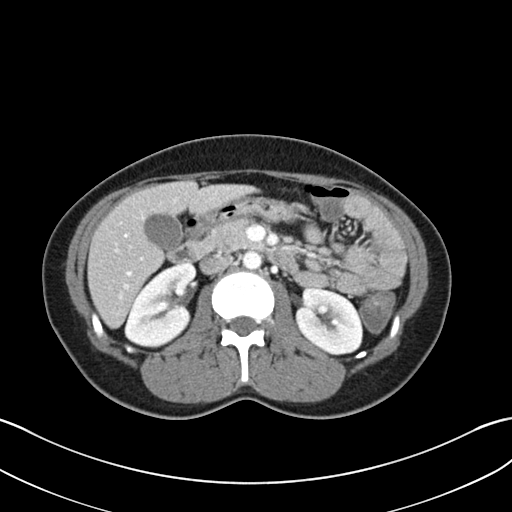
[im 63/95  bone]
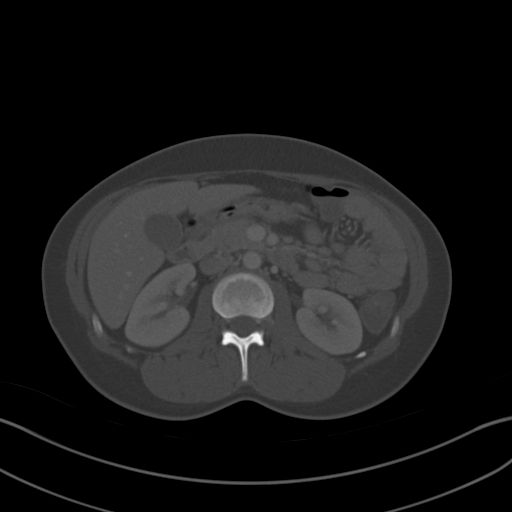
[im 68/95  soft-tissue]
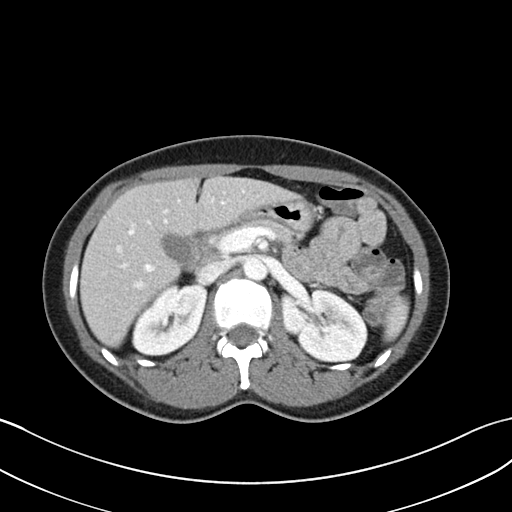
[im 74/95  soft-tissue]
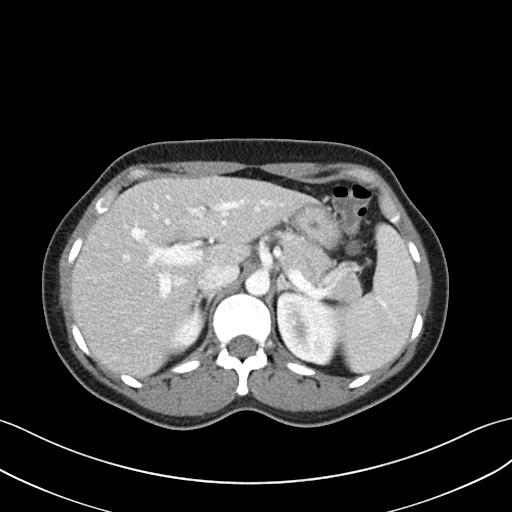
[im 84/95  soft-tissue]
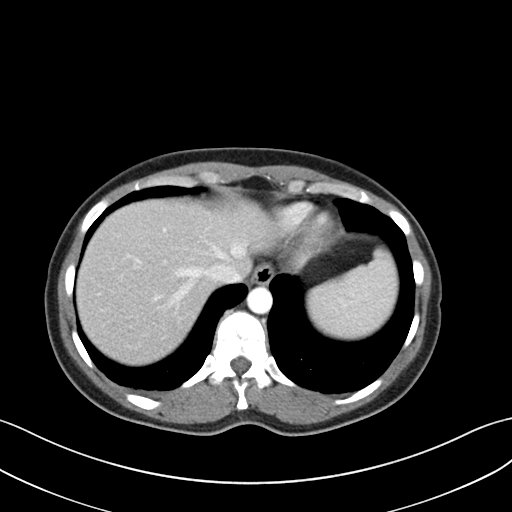
[im 89/95  soft-tissue]
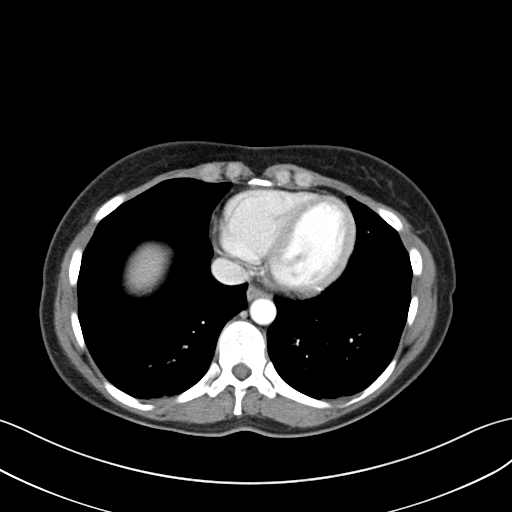

[Series 4: coronal st · coronal · 0.90mm/px · 3 of 109 slices shown]
[im 37/109  soft-tissue]
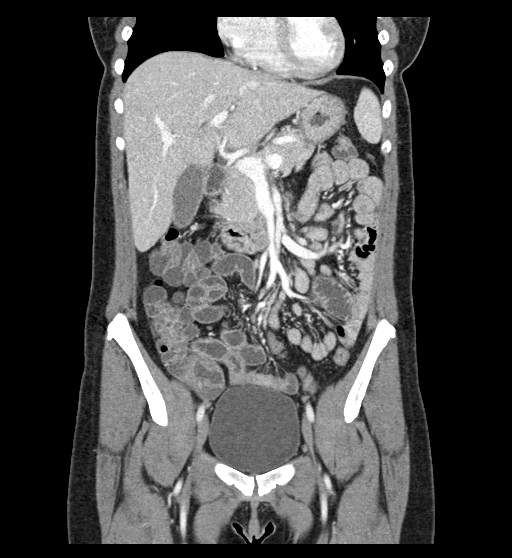
[im 49/109  soft-tissue]
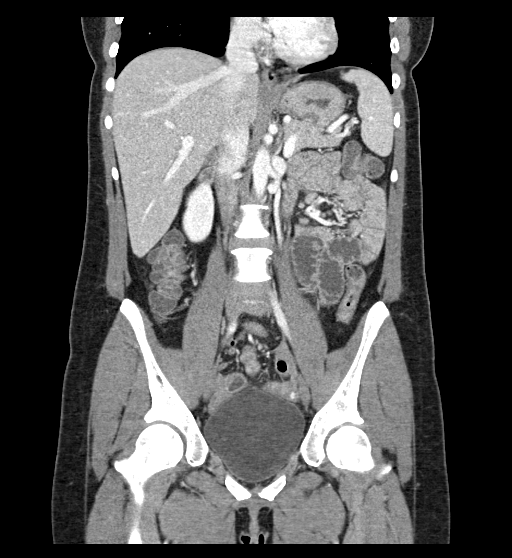
[im 61/109  soft-tissue]
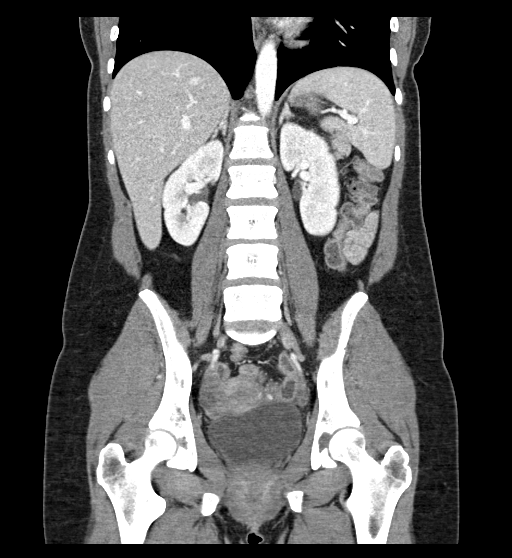

[16 of 46 positions shown; findings below may reference images not displayed]

FINDINGS: Lower chest: There is mild bibasilar pleural thickening. There is no
lung base edema or consolidation.

Hepatobiliary: No focal liver lesions are demonstrable. The
gallbladder wall is not appreciably thickened. There is no biliary
duct dilatation.

Pancreas: There is no pancreatic mass or inflammatory focus.

Spleen: No splenic lesions are evident.

Adrenals/Urinary Tract: Adrenals bilaterally appear unremarkable.
Kidneys bilaterally show no evident mass or hydronephrosis on either
side. There is no evident renal or ureteral calculus on either side.
Urinary bladder is midline with wall thickness within normal limits.

Stomach/Bowel: Most small bowel loops are fluid filled. There is no
appreciable bowel wall or mesenteric thickening. There is no
demonstrable bowel obstruction. There is no free air or portal
venous air. Terminal ileum appears normal although terminal ileum is
primarily fluid-filled like most of the rest of the small bowel.

Vascular/Lymphatic: There is no abdominal aortic aneurysm. No
vascular lesions are evident. There is no adenopathy in the abdomen
or pelvis.

Reproductive: Uterus is anteverted. There is no pelvic mass beyond a
follicle in the right ovary measuring 1.6 x 1.3 cm. There is mild to
moderate free fluid in the cul-de-sac, however.

Other: Appendix appears normal. There is no abscess in the abdomen
or pelvis. There is no ascites beyond the fluid in the cul-de-sac.

Musculoskeletal: No blastic or lytic bone lesions are evident. There
is no intramuscular or abdominal wall lesion.
IMPRESSION: 1. Mild to moderate free fluid in the cul-de-sac. Question recent
ovarian cyst rupture.

2. Most small bowel loops are fluid-filled. Suspect a degree of
enteritis or early ileus. No bowel obstruction. No wall thickening
involving bowel.

3.  No abscess in the abdomen or pelvis.  Appendix appears normal.

4. No renal or ureteral calculus. No hydronephrosis. Urinary bladder
wall thickness is within normal limits.

## 2024-02-25 ENCOUNTER — Encounter (HOSPITAL_BASED_OUTPATIENT_CLINIC_OR_DEPARTMENT_OTHER): Payer: Self-pay

## 2024-02-25 ENCOUNTER — Other Ambulatory Visit: Payer: Self-pay

## 2024-02-25 ENCOUNTER — Emergency Department (HOSPITAL_BASED_OUTPATIENT_CLINIC_OR_DEPARTMENT_OTHER)

## 2024-02-25 ENCOUNTER — Emergency Department (HOSPITAL_BASED_OUTPATIENT_CLINIC_OR_DEPARTMENT_OTHER)
Admission: EM | Admit: 2024-02-25 | Discharge: 2024-02-25 | Disposition: A | Discharge status: Home / Self Care | Attending: Emergency Medicine | Admitting: Emergency Medicine

## 2024-02-25 DIAGNOSIS — R42 Dizziness and giddiness: Secondary | ICD-10-CM | POA: Insufficient documentation

## 2024-02-25 DIAGNOSIS — R202 Paresthesia of skin: Secondary | ICD-10-CM

## 2024-02-25 LAB — DIFFERENTIAL
Abs Immature Granulocytes: 0.01 10*3/uL (ref 0.00–0.07)
Basophils Absolute: 0 10*3/uL (ref 0.0–0.1)
Basophils Relative: 0 %
Eosinophils Absolute: 0.1 10*3/uL (ref 0.0–0.5)
Eosinophils Relative: 2 %
Immature Granulocytes: 0 %
Lymphocytes Relative: 24 %
Lymphs Abs: 1.6 10*3/uL (ref 0.7–4.0)
Monocytes Absolute: 0.4 10*3/uL (ref 0.1–1.0)
Monocytes Relative: 5 %
Neutro Abs: 4.6 10*3/uL (ref 1.7–7.7)
Neutrophils Relative %: 69 %

## 2024-02-25 LAB — CBC
HCT: 38.7 % (ref 36.0–46.0)
Hemoglobin: 12.5 g/dL (ref 12.0–15.0)
MCH: 27.7 pg (ref 26.0–34.0)
MCHC: 32.3 g/dL (ref 30.0–36.0)
MCV: 85.8 fL (ref 80.0–100.0)
Platelets: 314 10*3/uL (ref 150–400)
RBC: 4.51 MIL/uL (ref 3.87–5.11)
RDW: 13.2 % (ref 11.5–15.5)
WBC: 6.6 10*3/uL (ref 4.0–10.5)
nRBC: 0 % (ref 0.0–0.2)

## 2024-02-25 LAB — COMPREHENSIVE METABOLIC PANEL WITH GFR
ALT: 28 U/L (ref 0–44)
AST: 26 U/L (ref 15–41)
Albumin: 4.7 g/dL (ref 3.5–5.0)
Alkaline Phosphatase: 53 U/L (ref 38–126)
Anion gap: 15 (ref 5–15)
BUN: 8 mg/dL (ref 6–20)
CO2: 24 mmol/L (ref 22–32)
Calcium: 9.7 mg/dL (ref 8.9–10.3)
Chloride: 101 mmol/L (ref 98–111)
Creatinine, Ser: 0.83 mg/dL (ref 0.44–1.00)
GFR, Estimated: >60 mL/min
Glucose, Bld: 141 mg/dL — ABNORMAL HIGH (ref 70–99)
Potassium: 3.7 mmol/L (ref 3.5–5.1)
Sodium: 140 mmol/L (ref 135–145)
Total Bilirubin: 0.5 mg/dL (ref 0.0–1.2)
Total Protein: 7.3 g/dL (ref 6.5–8.1)

## 2024-02-25 LAB — APTT: aPTT: 29 s (ref 24–36)

## 2024-02-25 LAB — PROTIME-INR
INR: 0.9 (ref 0.8–1.2)
Prothrombin Time: 12.7 s (ref 11.4–15.2)

## 2024-02-25 LAB — ETHANOL: Alcohol, Ethyl (B): <15 mg/dL

## 2024-02-25 MED ORDER — SODIUM CHLORIDE 0.9% FLUSH
3.0000 mL | Freq: Once | INTRAVENOUS | Status: AC
  Administered 2024-02-25: 3 mL via INTRAVENOUS

## 2024-02-25 NOTE — ED Triage Notes (Signed)
 Pt reports dizziness x45 min ago. Pt reports intermittent tingling in bilateral arms since yesterday. No weakness, slurred speech, or facial droop noted.

## 2024-02-25 NOTE — ED Notes (Signed)
 Pt d/c instructions, medications, and follow-up care reviewed with pt. Pt verbalized understanding and had no further questions at time of d/c. Pt CA&Ox4, ambulatory, and in NAD at time of d/c

## 2024-02-25 NOTE — Discharge Instructions (Addendum)
“  Your CT scan of the head and your blood work are normal, which means we do not see any signs of a stroke, bleeding, or other serious brain problem. Your exam is also normal. Based on your symptoms and sinus congestion, the dizziness is most likely related to sinus or inner-ear pressure. This is not dangerous and often improves on its own.  If the dizziness continues, please follow up with your primary care doctor, and they can refer you to neurology if needed. If you develop new symptoms like weakness, numbness, trouble speaking, vision changes, severe headache, or fainting, please return to the emergency room right away.    Discharge Instructions  CT head and labs are normal Dizziness likely related to sinus congestion Symptoms should improve with time  Follow-up: See your primary care provider if symptoms persist Neurology referral if dizziness continues or worsens  Return to ED for: Worsening dizziness New weakness, numbness, speech or vision changes Severe headache or fainting

## 2024-02-25 NOTE — ED Provider Notes (Signed)
 " [facility] Provider Note   CSN: [id number] Arrival date & time: 02/25/24  1500     Patient presents with: Dizziness   Kelly Fitzgerald is a 39 y.o. female with hx of migraine, presents with intermittent dizziness that began earlier today. She describes the sensation as lightheadedness and a feeling of imbalance, rather than true room-spinning vertigo. Symptoms are intermittent and not clearly positional.  She reports several days of sinus congestion, facial pressure, and post-nasal drip, and believes her dizziness is related to sinus symptoms. She endorses chills today but denies fever. She denies headache, neck stiffness, photophobia, vision changes, focal weakness, numbness, speech difficulty, syncope, chest pain, palpitations, shortness of breath, nausea, or vomiting.  She also notes brief, intermittent tingling in both arms, which has since resolved and was non-focal. She denies recent head trauma or similar prior episodes. There is no history of stroke, TIA, arrhythmia, or vestibular disorders. Last menstrual period was 02/11/2024.    Dizziness      Prior to Admission medications  Medication Sig Start Date End Date Taking? Authorizing Provider  acetaminophen  (TYLENOL ) 500 MG tablet Take 1,000 mg every 6 (six) hours as needed by mouth for moderate pain.    [provider]  ibuprofen  (ADVIL ) 600 MG tablet Take 1 tablet (600 mg total) by mouth every 6 (six) hours. 06/05/19   Linnell Devere BRAVO, MD  loperamide  (IMODIUM ) 2 MG capsule Take 1 capsule (2 mg total) by mouth 4 (four) times daily as needed for diarrhea or loose stools. Patient not taking: Reported on 06/03/2019 07/05/18   Shepard Clinch, PA-C  Prenatal Vit-Fe Fumarate-FA (PRENATAL MULTIVITAMIN) TABS tablet Take 2 tablets by mouth daily at 12 noon.    [provider]    Allergies: Amoxicillin, Erythromycin, and Penicillins    Review of Systems  Neurological:  Positive  for dizziness.  General: Well appearing, alert, no acute distress HEENT: PERRL, EOMI, no nystagmus, nasal congestion with post-nasal drip Neck: Supple, no meningismus Cardiovascular: Regular rate and rhythm Respiratory: Clear to auscultation bilaterally Neurologic: Alert and oriented 3, CN II-XII intact, strength 5/5, sensation intact, normal coordination and gait, no focal deficits Skin: Warm, dry  Updated Vital Signs BP (!) 141/96   Pulse 97   Temp 98.6 F (37 C) (Oral)   Resp 20   Ht 5' 6 (1.676 m)   Wt 72.1 kg   LMP 02/11/2024   SpO2 100%   BMI 25.66 kg/m   Physical Exam  (all labs ordered are listed, but only abnormal results are displayed) Labs Reviewed  PROTIME-INR  APTT  CBC  DIFFERENTIAL  COMPREHENSIVE METABOLIC PANEL WITH GFR  ETHANOL  PREGNANCY, URINE  CBG MONITORING, ED    EKG: None  Radiology: No results found.   Procedures   Medications Ordered in the ED  sodium chloride  flush (NS) 0.9 % injection 3 mL (has no administration in time range)                                    Medical Decision Making Amount and/or Complexity of Data Reviewed Labs: ordered. Radiology: ordered.   This is a 39 year old female presenting with intermittent dizziness in the setting of sinus congestion and post-nasal drip. She is afebrile, hemodynamically stable, and neurologically intact on exam. There are no red flag symptoms such as syncope, focal neurologic deficits, severe headache, or persistent vomiting.  Laboratory evaluation  was obtained to assess for metabolic, hematologic, and infectious causes and was unremarkable. CT head without contrast was performed due to reported dizziness and intermittent paresthesias and was negative for acute intracranial pathology.  Given the normal neurologic exam, reassuring labs, and negative imaging, there is low suspicion for central vertigo, stroke, intracranial hemorrhage, or cardiac etiology. Symptoms are most  consistent with peripheral dizziness, likely related to sinusitis or eustachian tube dysfunction.  The patient improved with supportive care in the ED and does not meet criteria for admission. She is safe for discharge with symptomatic treatment and outpatient follow-up.     Final diagnoses:  None    ED Discharge Orders     None          Bernadine Manos, MD 02/25/24 1817  "
